# Patient Record
Sex: Male | Born: 1950
Health system: Southern US, Community
[De-identification: ages and names within clinical notes are randomized; demographics above are authoritative.]

## PROBLEM LIST (undated history)

## (undated) DIAGNOSIS — I639 Cerebral infarction, unspecified: Secondary | ICD-10-CM

## (undated) DIAGNOSIS — I10 Essential (primary) hypertension: Secondary | ICD-10-CM

## (undated) DIAGNOSIS — F32A Depression, unspecified: Secondary | ICD-10-CM

## (undated) DIAGNOSIS — M1990 Unspecified osteoarthritis, unspecified site: Secondary | ICD-10-CM

## (undated) HISTORY — PX: NO PAST SURGERIES: SHX2092

---

## 1998-01-27 ENCOUNTER — Ambulatory Visit (HOSPITAL_COMMUNITY): Admission: RE | Admit: 1998-01-27 | Discharge: 1998-01-27 | Payer: Self-pay | Admitting: Otolaryngology

## 1998-01-27 ENCOUNTER — Encounter: Payer: Self-pay | Admitting: Otolaryngology

## 1998-07-22 ENCOUNTER — Encounter: Payer: Self-pay | Admitting: Gastroenterology

## 1998-07-22 ENCOUNTER — Ambulatory Visit (HOSPITAL_COMMUNITY): Admission: RE | Admit: 1998-07-22 | Discharge: 1998-07-22 | Payer: Self-pay | Admitting: Gastroenterology

## 1998-08-23 ENCOUNTER — Ambulatory Visit (HOSPITAL_COMMUNITY): Admission: RE | Admit: 1998-08-23 | Discharge: 1998-08-23 | Payer: Self-pay | Admitting: *Deleted

## 2001-11-19 ENCOUNTER — Emergency Department (HOSPITAL_COMMUNITY): Admission: EM | Admit: 2001-11-19 | Discharge: 2001-11-19 | Payer: Self-pay | Admitting: Emergency Medicine

## 2001-11-19 ENCOUNTER — Encounter: Payer: Self-pay | Admitting: Emergency Medicine

## 2004-02-11 ENCOUNTER — Emergency Department (HOSPITAL_COMMUNITY): Admission: EM | Admit: 2004-02-11 | Discharge: 2004-02-11 | Payer: Self-pay | Admitting: Emergency Medicine

## 2004-05-03 ENCOUNTER — Ambulatory Visit (HOSPITAL_COMMUNITY): Admission: RE | Admit: 2004-05-03 | Discharge: 2004-05-03 | Payer: Self-pay | Admitting: Internal Medicine

## 2012-04-07 ENCOUNTER — Other Ambulatory Visit (HOSPITAL_COMMUNITY): Payer: Self-pay | Admitting: Internal Medicine

## 2012-04-07 ENCOUNTER — Ambulatory Visit (HOSPITAL_COMMUNITY)
Admission: RE | Admit: 2012-04-07 | Discharge: 2012-04-07 | Disposition: A | Discharge status: Home / Self Care | Payer: MEDICARE | Source: Ambulatory Visit | Attending: Internal Medicine | Admitting: Internal Medicine

## 2012-04-07 DIAGNOSIS — Q762 Congenital spondylolisthesis: Secondary | ICD-10-CM | POA: Insufficient documentation

## 2012-04-07 DIAGNOSIS — M4120 Other idiopathic scoliosis, site unspecified: Secondary | ICD-10-CM

## 2012-04-07 DIAGNOSIS — M545 Low back pain, unspecified: Secondary | ICD-10-CM | POA: Insufficient documentation

## 2012-04-07 DIAGNOSIS — M51379 Other intervertebral disc degeneration, lumbosacral region without mention of lumbar back pain or lower extremity pain: Secondary | ICD-10-CM | POA: Insufficient documentation

## 2015-03-22 DIAGNOSIS — I10 Essential (primary) hypertension: Secondary | ICD-10-CM | POA: Diagnosis not present

## 2015-03-29 DIAGNOSIS — I10 Essential (primary) hypertension: Secondary | ICD-10-CM | POA: Diagnosis not present

## 2015-03-29 DIAGNOSIS — F418 Other specified anxiety disorders: Secondary | ICD-10-CM | POA: Diagnosis not present

## 2015-03-29 DIAGNOSIS — F79 Unspecified intellectual disabilities: Secondary | ICD-10-CM | POA: Diagnosis not present

## 2015-03-29 DIAGNOSIS — Z6830 Body mass index (BMI) 30.0-30.9, adult: Secondary | ICD-10-CM | POA: Diagnosis not present

## 2015-03-29 DIAGNOSIS — M4120 Other idiopathic scoliosis, site unspecified: Secondary | ICD-10-CM | POA: Diagnosis not present

## 2015-03-29 DIAGNOSIS — J3089 Other allergic rhinitis: Secondary | ICD-10-CM | POA: Diagnosis not present

## 2015-04-26 DIAGNOSIS — F3342 Major depressive disorder, recurrent, in full remission: Secondary | ICD-10-CM | POA: Diagnosis not present

## 2015-04-27 DIAGNOSIS — Z6830 Body mass index (BMI) 30.0-30.9, adult: Secondary | ICD-10-CM | POA: Diagnosis not present

## 2015-04-27 DIAGNOSIS — M19011 Primary osteoarthritis, right shoulder: Secondary | ICD-10-CM | POA: Diagnosis not present

## 2015-04-27 DIAGNOSIS — M25511 Pain in right shoulder: Secondary | ICD-10-CM | POA: Diagnosis not present

## 2015-04-28 DIAGNOSIS — M7541 Impingement syndrome of right shoulder: Secondary | ICD-10-CM | POA: Diagnosis not present

## 2015-04-28 DIAGNOSIS — M7501 Adhesive capsulitis of right shoulder: Secondary | ICD-10-CM | POA: Diagnosis not present

## 2015-04-28 DIAGNOSIS — M19011 Primary osteoarthritis, right shoulder: Secondary | ICD-10-CM | POA: Diagnosis not present

## 2015-04-28 DIAGNOSIS — M25511 Pain in right shoulder: Secondary | ICD-10-CM | POA: Diagnosis not present

## 2015-05-10 DIAGNOSIS — M7541 Impingement syndrome of right shoulder: Secondary | ICD-10-CM | POA: Diagnosis not present

## 2015-05-17 DIAGNOSIS — M7541 Impingement syndrome of right shoulder: Secondary | ICD-10-CM | POA: Diagnosis not present

## 2015-05-24 DIAGNOSIS — M7541 Impingement syndrome of right shoulder: Secondary | ICD-10-CM | POA: Diagnosis not present

## 2015-05-26 DIAGNOSIS — M19011 Primary osteoarthritis, right shoulder: Secondary | ICD-10-CM | POA: Diagnosis not present

## 2015-05-26 DIAGNOSIS — M25511 Pain in right shoulder: Secondary | ICD-10-CM | POA: Diagnosis not present

## 2015-05-26 DIAGNOSIS — M7541 Impingement syndrome of right shoulder: Secondary | ICD-10-CM | POA: Diagnosis not present

## 2015-05-26 DIAGNOSIS — M7501 Adhesive capsulitis of right shoulder: Secondary | ICD-10-CM | POA: Diagnosis not present

## 2015-06-13 DIAGNOSIS — H25099 Other age-related incipient cataract, unspecified eye: Secondary | ICD-10-CM | POA: Diagnosis not present

## 2015-06-13 DIAGNOSIS — H5203 Hypermetropia, bilateral: Secondary | ICD-10-CM | POA: Diagnosis not present

## 2015-06-13 DIAGNOSIS — H524 Presbyopia: Secondary | ICD-10-CM | POA: Diagnosis not present

## 2015-06-13 DIAGNOSIS — H179 Unspecified corneal scar and opacity: Secondary | ICD-10-CM | POA: Diagnosis not present

## 2015-07-08 DIAGNOSIS — H903 Sensorineural hearing loss, bilateral: Secondary | ICD-10-CM | POA: Diagnosis not present

## 2015-07-08 DIAGNOSIS — H6122 Impacted cerumen, left ear: Secondary | ICD-10-CM | POA: Diagnosis not present

## 2015-10-06 DIAGNOSIS — I10 Essential (primary) hypertension: Secondary | ICD-10-CM | POA: Diagnosis not present

## 2015-10-06 DIAGNOSIS — Z125 Encounter for screening for malignant neoplasm of prostate: Secondary | ICD-10-CM | POA: Diagnosis not present

## 2015-10-13 DIAGNOSIS — K279 Peptic ulcer, site unspecified, unspecified as acute or chronic, without hemorrhage or perforation: Secondary | ICD-10-CM | POA: Diagnosis not present

## 2015-10-13 DIAGNOSIS — F418 Other specified anxiety disorders: Secondary | ICD-10-CM | POA: Diagnosis not present

## 2015-10-13 DIAGNOSIS — J309 Allergic rhinitis, unspecified: Secondary | ICD-10-CM | POA: Diagnosis not present

## 2015-10-13 DIAGNOSIS — F79 Unspecified intellectual disabilities: Secondary | ICD-10-CM | POA: Diagnosis not present

## 2015-10-13 DIAGNOSIS — Z6830 Body mass index (BMI) 30.0-30.9, adult: Secondary | ICD-10-CM | POA: Diagnosis not present

## 2015-10-13 DIAGNOSIS — R972 Elevated prostate specific antigen [PSA]: Secondary | ICD-10-CM | POA: Diagnosis not present

## 2015-10-13 DIAGNOSIS — Z1389 Encounter for screening for other disorder: Secondary | ICD-10-CM | POA: Diagnosis not present

## 2015-10-13 DIAGNOSIS — Z0000 Encounter for general adult medical examination without abnormal findings: Secondary | ICD-10-CM | POA: Diagnosis not present

## 2015-10-13 DIAGNOSIS — I10 Essential (primary) hypertension: Secondary | ICD-10-CM | POA: Diagnosis not present

## 2015-10-13 DIAGNOSIS — Z23 Encounter for immunization: Secondary | ICD-10-CM | POA: Diagnosis not present

## 2015-10-13 DIAGNOSIS — M4120 Other idiopathic scoliosis, site unspecified: Secondary | ICD-10-CM | POA: Diagnosis not present

## 2015-10-13 DIAGNOSIS — M25511 Pain in right shoulder: Secondary | ICD-10-CM | POA: Diagnosis not present

## 2015-10-14 DIAGNOSIS — Z1212 Encounter for screening for malignant neoplasm of rectum: Secondary | ICD-10-CM | POA: Diagnosis not present

## 2016-04-02 DIAGNOSIS — K279 Peptic ulcer, site unspecified, unspecified as acute or chronic, without hemorrhage or perforation: Secondary | ICD-10-CM | POA: Diagnosis not present

## 2016-04-02 DIAGNOSIS — F79 Unspecified intellectual disabilities: Secondary | ICD-10-CM | POA: Diagnosis not present

## 2016-04-02 DIAGNOSIS — F418 Other specified anxiety disorders: Secondary | ICD-10-CM | POA: Diagnosis not present

## 2016-04-02 DIAGNOSIS — M4120 Other idiopathic scoliosis, site unspecified: Secondary | ICD-10-CM | POA: Diagnosis not present

## 2016-04-02 DIAGNOSIS — Z23 Encounter for immunization: Secondary | ICD-10-CM | POA: Diagnosis not present

## 2016-04-02 DIAGNOSIS — Z6828 Body mass index (BMI) 28.0-28.9, adult: Secondary | ICD-10-CM | POA: Diagnosis not present

## 2016-04-02 DIAGNOSIS — I10 Essential (primary) hypertension: Secondary | ICD-10-CM | POA: Diagnosis not present

## 2016-04-19 DIAGNOSIS — F3342 Major depressive disorder, recurrent, in full remission: Secondary | ICD-10-CM | POA: Diagnosis not present

## 2016-06-26 DIAGNOSIS — H25812 Combined forms of age-related cataract, left eye: Secondary | ICD-10-CM | POA: Diagnosis not present

## 2016-06-26 DIAGNOSIS — H211X2 Other vascular disorders of iris and ciliary body, left eye: Secondary | ICD-10-CM | POA: Diagnosis not present

## 2016-06-26 DIAGNOSIS — H18412 Arcus senilis, left eye: Secondary | ICD-10-CM | POA: Diagnosis not present

## 2016-06-26 DIAGNOSIS — H16402 Unspecified corneal neovascularization, left eye: Secondary | ICD-10-CM | POA: Diagnosis not present

## 2016-07-02 DIAGNOSIS — M4120 Other idiopathic scoliosis, site unspecified: Secondary | ICD-10-CM | POA: Diagnosis not present

## 2016-07-02 DIAGNOSIS — Z6829 Body mass index (BMI) 29.0-29.9, adult: Secondary | ICD-10-CM | POA: Diagnosis not present

## 2016-07-02 DIAGNOSIS — I10 Essential (primary) hypertension: Secondary | ICD-10-CM | POA: Diagnosis not present

## 2016-07-02 DIAGNOSIS — G8929 Other chronic pain: Secondary | ICD-10-CM | POA: Diagnosis not present

## 2016-07-03 DIAGNOSIS — H903 Sensorineural hearing loss, bilateral: Secondary | ICD-10-CM | POA: Diagnosis not present

## 2016-07-03 DIAGNOSIS — H6122 Impacted cerumen, left ear: Secondary | ICD-10-CM | POA: Diagnosis not present

## 2016-11-06 DIAGNOSIS — Z125 Encounter for screening for malignant neoplasm of prostate: Secondary | ICD-10-CM | POA: Diagnosis not present

## 2016-11-06 DIAGNOSIS — I10 Essential (primary) hypertension: Secondary | ICD-10-CM | POA: Diagnosis not present

## 2016-11-13 DIAGNOSIS — M4120 Other idiopathic scoliosis, site unspecified: Secondary | ICD-10-CM | POA: Diagnosis not present

## 2016-11-13 DIAGNOSIS — F418 Other specified anxiety disorders: Secondary | ICD-10-CM | POA: Diagnosis not present

## 2016-11-13 DIAGNOSIS — F79 Unspecified intellectual disabilities: Secondary | ICD-10-CM | POA: Diagnosis not present

## 2016-11-13 DIAGNOSIS — R972 Elevated prostate specific antigen [PSA]: Secondary | ICD-10-CM | POA: Diagnosis not present

## 2016-11-13 DIAGNOSIS — H9190 Unspecified hearing loss, unspecified ear: Secondary | ICD-10-CM | POA: Diagnosis not present

## 2016-11-13 DIAGNOSIS — Z23 Encounter for immunization: Secondary | ICD-10-CM | POA: Diagnosis not present

## 2016-11-13 DIAGNOSIS — Z0000 Encounter for general adult medical examination without abnormal findings: Secondary | ICD-10-CM | POA: Diagnosis not present

## 2016-11-13 DIAGNOSIS — R413 Other amnesia: Secondary | ICD-10-CM | POA: Diagnosis not present

## 2016-11-13 DIAGNOSIS — I10 Essential (primary) hypertension: Secondary | ICD-10-CM | POA: Diagnosis not present

## 2016-11-13 DIAGNOSIS — Z6830 Body mass index (BMI) 30.0-30.9, adult: Secondary | ICD-10-CM | POA: Diagnosis not present

## 2016-11-13 DIAGNOSIS — G8929 Other chronic pain: Secondary | ICD-10-CM | POA: Diagnosis not present

## 2016-11-13 DIAGNOSIS — K279 Peptic ulcer, site unspecified, unspecified as acute or chronic, without hemorrhage or perforation: Secondary | ICD-10-CM | POA: Diagnosis not present

## 2016-11-13 DIAGNOSIS — Z1389 Encounter for screening for other disorder: Secondary | ICD-10-CM | POA: Diagnosis not present

## 2016-11-23 DIAGNOSIS — Z1212 Encounter for screening for malignant neoplasm of rectum: Secondary | ICD-10-CM | POA: Diagnosis not present

## 2017-02-20 DIAGNOSIS — G8929 Other chronic pain: Secondary | ICD-10-CM | POA: Diagnosis not present

## 2017-02-20 DIAGNOSIS — Z6831 Body mass index (BMI) 31.0-31.9, adult: Secondary | ICD-10-CM | POA: Diagnosis not present

## 2017-04-16 DIAGNOSIS — F3342 Major depressive disorder, recurrent, in full remission: Secondary | ICD-10-CM | POA: Diagnosis not present

## 2017-04-16 DIAGNOSIS — G809 Cerebral palsy, unspecified: Secondary | ICD-10-CM | POA: Diagnosis not present

## 2017-05-14 DIAGNOSIS — F79 Unspecified intellectual disabilities: Secondary | ICD-10-CM | POA: Diagnosis not present

## 2017-05-14 DIAGNOSIS — F418 Other specified anxiety disorders: Secondary | ICD-10-CM | POA: Diagnosis not present

## 2017-05-14 DIAGNOSIS — Z6831 Body mass index (BMI) 31.0-31.9, adult: Secondary | ICD-10-CM | POA: Diagnosis not present

## 2017-05-14 DIAGNOSIS — I10 Essential (primary) hypertension: Secondary | ICD-10-CM | POA: Diagnosis not present

## 2017-05-14 DIAGNOSIS — M4120 Other idiopathic scoliosis, site unspecified: Secondary | ICD-10-CM | POA: Diagnosis not present

## 2017-05-14 DIAGNOSIS — G8929 Other chronic pain: Secondary | ICD-10-CM | POA: Diagnosis not present

## 2017-05-14 DIAGNOSIS — R413 Other amnesia: Secondary | ICD-10-CM | POA: Diagnosis not present

## 2017-07-01 DIAGNOSIS — H6122 Impacted cerumen, left ear: Secondary | ICD-10-CM | POA: Diagnosis not present

## 2017-07-09 DIAGNOSIS — H524 Presbyopia: Secondary | ICD-10-CM | POA: Diagnosis not present

## 2017-07-09 DIAGNOSIS — H16402 Unspecified corneal neovascularization, left eye: Secondary | ICD-10-CM | POA: Diagnosis not present

## 2017-07-09 DIAGNOSIS — H25813 Combined forms of age-related cataract, bilateral: Secondary | ICD-10-CM | POA: Diagnosis not present

## 2017-07-09 DIAGNOSIS — H18412 Arcus senilis, left eye: Secondary | ICD-10-CM | POA: Diagnosis not present

## 2017-07-09 DIAGNOSIS — H5203 Hypermetropia, bilateral: Secondary | ICD-10-CM | POA: Diagnosis not present

## 2017-07-09 DIAGNOSIS — H211X2 Other vascular disorders of iris and ciliary body, left eye: Secondary | ICD-10-CM | POA: Diagnosis not present

## 2017-08-21 DIAGNOSIS — M4120 Other idiopathic scoliosis, site unspecified: Secondary | ICD-10-CM | POA: Diagnosis not present

## 2017-08-21 DIAGNOSIS — R269 Unspecified abnormalities of gait and mobility: Secondary | ICD-10-CM | POA: Diagnosis not present

## 2017-08-21 DIAGNOSIS — Z6831 Body mass index (BMI) 31.0-31.9, adult: Secondary | ICD-10-CM | POA: Diagnosis not present

## 2017-11-07 DIAGNOSIS — G8929 Other chronic pain: Secondary | ICD-10-CM | POA: Diagnosis not present

## 2017-11-07 DIAGNOSIS — M79606 Pain in leg, unspecified: Secondary | ICD-10-CM | POA: Diagnosis not present

## 2017-11-07 DIAGNOSIS — I10 Essential (primary) hypertension: Secondary | ICD-10-CM | POA: Diagnosis not present

## 2017-11-07 DIAGNOSIS — R269 Unspecified abnormalities of gait and mobility: Secondary | ICD-10-CM | POA: Diagnosis not present

## 2017-11-07 DIAGNOSIS — Z23 Encounter for immunization: Secondary | ICD-10-CM | POA: Diagnosis not present

## 2017-12-17 DIAGNOSIS — R82998 Other abnormal findings in urine: Secondary | ICD-10-CM | POA: Diagnosis not present

## 2017-12-17 DIAGNOSIS — I10 Essential (primary) hypertension: Secondary | ICD-10-CM | POA: Diagnosis not present

## 2017-12-17 DIAGNOSIS — Z125 Encounter for screening for malignant neoplasm of prostate: Secondary | ICD-10-CM | POA: Diagnosis not present

## 2017-12-24 DIAGNOSIS — M4120 Other idiopathic scoliosis, site unspecified: Secondary | ICD-10-CM | POA: Diagnosis not present

## 2017-12-24 DIAGNOSIS — Z0000 Encounter for general adult medical examination without abnormal findings: Secondary | ICD-10-CM | POA: Diagnosis not present

## 2017-12-24 DIAGNOSIS — Z1389 Encounter for screening for other disorder: Secondary | ICD-10-CM | POA: Diagnosis not present

## 2017-12-24 DIAGNOSIS — I10 Essential (primary) hypertension: Secondary | ICD-10-CM | POA: Diagnosis not present

## 2017-12-24 DIAGNOSIS — R413 Other amnesia: Secondary | ICD-10-CM | POA: Diagnosis not present

## 2017-12-24 DIAGNOSIS — G8929 Other chronic pain: Secondary | ICD-10-CM | POA: Diagnosis not present

## 2017-12-24 DIAGNOSIS — J3089 Other allergic rhinitis: Secondary | ICD-10-CM | POA: Diagnosis not present

## 2017-12-24 DIAGNOSIS — R2689 Other abnormalities of gait and mobility: Secondary | ICD-10-CM | POA: Diagnosis not present

## 2017-12-24 DIAGNOSIS — Z6835 Body mass index (BMI) 35.0-35.9, adult: Secondary | ICD-10-CM | POA: Diagnosis not present

## 2017-12-24 DIAGNOSIS — F418 Other specified anxiety disorders: Secondary | ICD-10-CM | POA: Diagnosis not present

## 2017-12-24 DIAGNOSIS — Z23 Encounter for immunization: Secondary | ICD-10-CM | POA: Diagnosis not present

## 2017-12-24 DIAGNOSIS — F79 Unspecified intellectual disabilities: Secondary | ICD-10-CM | POA: Diagnosis not present

## 2017-12-27 DIAGNOSIS — Z1212 Encounter for screening for malignant neoplasm of rectum: Secondary | ICD-10-CM | POA: Diagnosis not present

## 2018-03-25 DIAGNOSIS — I10 Essential (primary) hypertension: Secondary | ICD-10-CM | POA: Diagnosis not present

## 2018-03-25 DIAGNOSIS — Z6833 Body mass index (BMI) 33.0-33.9, adult: Secondary | ICD-10-CM | POA: Diagnosis not present

## 2018-03-25 DIAGNOSIS — M4120 Other idiopathic scoliosis, site unspecified: Secondary | ICD-10-CM | POA: Diagnosis not present

## 2018-03-25 DIAGNOSIS — G8929 Other chronic pain: Secondary | ICD-10-CM | POA: Diagnosis not present

## 2018-05-06 DIAGNOSIS — G809 Cerebral palsy, unspecified: Secondary | ICD-10-CM | POA: Diagnosis not present

## 2018-05-06 DIAGNOSIS — F3342 Major depressive disorder, recurrent, in full remission: Secondary | ICD-10-CM | POA: Diagnosis not present

## 2018-06-23 DIAGNOSIS — F418 Other specified anxiety disorders: Secondary | ICD-10-CM | POA: Diagnosis not present

## 2018-06-23 DIAGNOSIS — K279 Peptic ulcer, site unspecified, unspecified as acute or chronic, without hemorrhage or perforation: Secondary | ICD-10-CM | POA: Diagnosis not present

## 2018-06-23 DIAGNOSIS — G8929 Other chronic pain: Secondary | ICD-10-CM | POA: Diagnosis not present

## 2018-06-23 DIAGNOSIS — F79 Unspecified intellectual disabilities: Secondary | ICD-10-CM | POA: Diagnosis not present

## 2018-06-23 DIAGNOSIS — M4120 Other idiopathic scoliosis, site unspecified: Secondary | ICD-10-CM | POA: Diagnosis not present

## 2018-06-23 DIAGNOSIS — I10 Essential (primary) hypertension: Secondary | ICD-10-CM | POA: Diagnosis not present

## 2018-09-18 DIAGNOSIS — M4120 Other idiopathic scoliosis, site unspecified: Secondary | ICD-10-CM | POA: Diagnosis not present

## 2018-09-18 DIAGNOSIS — I10 Essential (primary) hypertension: Secondary | ICD-10-CM | POA: Diagnosis not present

## 2018-09-18 DIAGNOSIS — F418 Other specified anxiety disorders: Secondary | ICD-10-CM | POA: Diagnosis not present

## 2018-09-18 DIAGNOSIS — F79 Unspecified intellectual disabilities: Secondary | ICD-10-CM | POA: Diagnosis not present

## 2018-09-18 DIAGNOSIS — J069 Acute upper respiratory infection, unspecified: Secondary | ICD-10-CM | POA: Diagnosis not present

## 2018-09-18 DIAGNOSIS — G8929 Other chronic pain: Secondary | ICD-10-CM | POA: Diagnosis not present

## 2018-09-18 DIAGNOSIS — J309 Allergic rhinitis, unspecified: Secondary | ICD-10-CM | POA: Diagnosis not present

## 2018-09-18 DIAGNOSIS — R413 Other amnesia: Secondary | ICD-10-CM | POA: Diagnosis not present

## 2018-09-25 DIAGNOSIS — R000 Tachycardia, unspecified: Secondary | ICD-10-CM | POA: Diagnosis not present

## 2018-09-25 DIAGNOSIS — R05 Cough: Secondary | ICD-10-CM | POA: Diagnosis not present

## 2018-09-25 DIAGNOSIS — J069 Acute upper respiratory infection, unspecified: Secondary | ICD-10-CM | POA: Diagnosis not present

## 2018-09-25 DIAGNOSIS — Z20818 Contact with and (suspected) exposure to other bacterial communicable diseases: Secondary | ICD-10-CM | POA: Diagnosis not present

## 2018-10-02 DIAGNOSIS — H903 Sensorineural hearing loss, bilateral: Secondary | ICD-10-CM | POA: Diagnosis not present

## 2018-10-08 DIAGNOSIS — H6123 Impacted cerumen, bilateral: Secondary | ICD-10-CM | POA: Diagnosis not present

## 2018-10-16 DIAGNOSIS — Z23 Encounter for immunization: Secondary | ICD-10-CM | POA: Diagnosis not present

## 2018-10-30 DIAGNOSIS — H524 Presbyopia: Secondary | ICD-10-CM | POA: Diagnosis not present

## 2018-10-30 DIAGNOSIS — H1851 Endothelial corneal dystrophy: Secondary | ICD-10-CM | POA: Diagnosis not present

## 2018-10-30 DIAGNOSIS — H25813 Combined forms of age-related cataract, bilateral: Secondary | ICD-10-CM | POA: Diagnosis not present

## 2018-10-30 DIAGNOSIS — H5203 Hypermetropia, bilateral: Secondary | ICD-10-CM | POA: Diagnosis not present

## 2018-12-17 DIAGNOSIS — H16221 Keratoconjunctivitis sicca, not specified as Sjogren's, right eye: Secondary | ICD-10-CM | POA: Diagnosis not present

## 2018-12-17 DIAGNOSIS — H16223 Keratoconjunctivitis sicca, not specified as Sjogren's, bilateral: Secondary | ICD-10-CM | POA: Diagnosis not present

## 2018-12-17 DIAGNOSIS — H2513 Age-related nuclear cataract, bilateral: Secondary | ICD-10-CM | POA: Diagnosis not present

## 2018-12-17 DIAGNOSIS — H40001 Preglaucoma, unspecified, right eye: Secondary | ICD-10-CM | POA: Diagnosis not present

## 2018-12-17 DIAGNOSIS — H25013 Cortical age-related cataract, bilateral: Secondary | ICD-10-CM | POA: Diagnosis not present

## 2018-12-17 DIAGNOSIS — H16222 Keratoconjunctivitis sicca, not specified as Sjogren's, left eye: Secondary | ICD-10-CM | POA: Diagnosis not present

## 2018-12-22 DIAGNOSIS — H16223 Keratoconjunctivitis sicca, not specified as Sjogren's, bilateral: Secondary | ICD-10-CM | POA: Diagnosis not present

## 2018-12-22 DIAGNOSIS — H5440 Blindness, one eye, unspecified eye: Secondary | ICD-10-CM | POA: Diagnosis not present

## 2018-12-22 DIAGNOSIS — H2513 Age-related nuclear cataract, bilateral: Secondary | ICD-10-CM | POA: Diagnosis not present

## 2018-12-22 DIAGNOSIS — H40011 Open angle with borderline findings, low risk, right eye: Secondary | ICD-10-CM | POA: Diagnosis not present

## 2018-12-30 DIAGNOSIS — H2511 Age-related nuclear cataract, right eye: Secondary | ICD-10-CM | POA: Diagnosis not present

## 2018-12-30 DIAGNOSIS — H2513 Age-related nuclear cataract, bilateral: Secondary | ICD-10-CM | POA: Diagnosis not present

## 2019-01-04 DIAGNOSIS — R7989 Other specified abnormal findings of blood chemistry: Secondary | ICD-10-CM | POA: Diagnosis not present

## 2019-01-04 DIAGNOSIS — Z125 Encounter for screening for malignant neoplasm of prostate: Secondary | ICD-10-CM | POA: Diagnosis not present

## 2019-01-04 DIAGNOSIS — I10 Essential (primary) hypertension: Secondary | ICD-10-CM | POA: Diagnosis not present

## 2019-01-05 DIAGNOSIS — H2511 Age-related nuclear cataract, right eye: Secondary | ICD-10-CM | POA: Diagnosis not present

## 2019-01-05 DIAGNOSIS — H2513 Age-related nuclear cataract, bilateral: Secondary | ICD-10-CM | POA: Diagnosis not present

## 2019-01-12 DIAGNOSIS — I10 Essential (primary) hypertension: Secondary | ICD-10-CM | POA: Diagnosis not present

## 2019-01-12 DIAGNOSIS — R413 Other amnesia: Secondary | ICD-10-CM | POA: Diagnosis not present

## 2019-01-12 DIAGNOSIS — Z23 Encounter for immunization: Secondary | ICD-10-CM | POA: Diagnosis not present

## 2019-01-12 DIAGNOSIS — H547 Unspecified visual loss: Secondary | ICD-10-CM | POA: Diagnosis not present

## 2019-01-12 DIAGNOSIS — R82998 Other abnormal findings in urine: Secondary | ICD-10-CM | POA: Diagnosis not present

## 2019-01-12 DIAGNOSIS — H9190 Unspecified hearing loss, unspecified ear: Secondary | ICD-10-CM | POA: Diagnosis not present

## 2019-01-12 DIAGNOSIS — J309 Allergic rhinitis, unspecified: Secondary | ICD-10-CM | POA: Diagnosis not present

## 2019-01-12 DIAGNOSIS — F79 Unspecified intellectual disabilities: Secondary | ICD-10-CM | POA: Diagnosis not present

## 2019-01-12 DIAGNOSIS — R972 Elevated prostate specific antigen [PSA]: Secondary | ICD-10-CM | POA: Diagnosis not present

## 2019-01-12 DIAGNOSIS — Z0000 Encounter for general adult medical examination without abnormal findings: Secondary | ICD-10-CM | POA: Diagnosis not present

## 2019-01-12 DIAGNOSIS — F418 Other specified anxiety disorders: Secondary | ICD-10-CM | POA: Diagnosis not present

## 2019-01-12 DIAGNOSIS — M4120 Other idiopathic scoliosis, site unspecified: Secondary | ICD-10-CM | POA: Diagnosis not present

## 2019-02-16 DIAGNOSIS — H44513 Absolute glaucoma, bilateral: Secondary | ICD-10-CM | POA: Diagnosis not present

## 2019-02-16 DIAGNOSIS — H401134 Primary open-angle glaucoma, bilateral, indeterminate stage: Secondary | ICD-10-CM | POA: Diagnosis not present

## 2019-02-24 DIAGNOSIS — Z20822 Contact with and (suspected) exposure to covid-19: Secondary | ICD-10-CM | POA: Diagnosis not present

## 2019-02-24 DIAGNOSIS — H44513 Absolute glaucoma, bilateral: Secondary | ICD-10-CM | POA: Diagnosis not present

## 2019-02-24 DIAGNOSIS — Z01812 Encounter for preprocedural laboratory examination: Secondary | ICD-10-CM | POA: Diagnosis not present

## 2019-03-03 DIAGNOSIS — H44511 Absolute glaucoma, right eye: Secondary | ICD-10-CM | POA: Diagnosis not present

## 2019-03-03 DIAGNOSIS — Z8711 Personal history of peptic ulcer disease: Secondary | ICD-10-CM | POA: Diagnosis not present

## 2019-03-03 DIAGNOSIS — M419 Scoliosis, unspecified: Secondary | ICD-10-CM | POA: Diagnosis not present

## 2019-03-03 DIAGNOSIS — F419 Anxiety disorder, unspecified: Secondary | ICD-10-CM | POA: Diagnosis not present

## 2019-03-03 DIAGNOSIS — Z8673 Personal history of transient ischemic attack (TIA), and cerebral infarction without residual deficits: Secondary | ICD-10-CM | POA: Diagnosis not present

## 2019-03-03 DIAGNOSIS — I10 Essential (primary) hypertension: Secondary | ICD-10-CM | POA: Diagnosis not present

## 2019-03-04 DIAGNOSIS — H44513 Absolute glaucoma, bilateral: Secondary | ICD-10-CM | POA: Diagnosis not present

## 2019-03-04 DIAGNOSIS — Z79899 Other long term (current) drug therapy: Secondary | ICD-10-CM | POA: Diagnosis not present

## 2019-03-04 DIAGNOSIS — Z48810 Encounter for surgical aftercare following surgery on the sense organs: Secondary | ICD-10-CM | POA: Diagnosis not present

## 2019-03-04 DIAGNOSIS — Z1212 Encounter for screening for malignant neoplasm of rectum: Secondary | ICD-10-CM | POA: Diagnosis not present

## 2019-03-10 ENCOUNTER — Ambulatory Visit: Payer: Self-pay

## 2019-03-19 ENCOUNTER — Ambulatory Visit: Payer: Medicare Other | Attending: Internal Medicine

## 2019-03-19 DIAGNOSIS — Z23 Encounter for immunization: Secondary | ICD-10-CM | POA: Insufficient documentation

## 2019-03-31 ENCOUNTER — Ambulatory Visit: Payer: Self-pay

## 2019-04-07 DIAGNOSIS — G8929 Other chronic pain: Secondary | ICD-10-CM | POA: Diagnosis not present

## 2019-04-07 DIAGNOSIS — L299 Pruritus, unspecified: Secondary | ICD-10-CM | POA: Diagnosis not present

## 2019-04-07 DIAGNOSIS — M4120 Other idiopathic scoliosis, site unspecified: Secondary | ICD-10-CM | POA: Diagnosis not present

## 2019-04-13 ENCOUNTER — Ambulatory Visit: Payer: Medicare Other | Attending: Internal Medicine

## 2019-04-13 DIAGNOSIS — Z23 Encounter for immunization: Secondary | ICD-10-CM | POA: Insufficient documentation

## 2019-04-13 NOTE — Progress Notes (Signed)
   Covid-19 Vaccination [facility]  Name:  Jeffery Fox    MRN: [id number] DOB: [birth year]  04/13/2019  [name] was observed post Covid-19 immunization for 15 minutes without incidence. He was provided with Vaccine Information Sheet and instruction to access the V-Safe system.   [name] was instructed to call 911 with any severe reactions post vaccine: Marland Kitchen Difficulty breathing  . Swelling of your face and throat  . A fast heartbeat  . A bad rash all over your body  . Dizziness and weakness    Immunizations Administered    Name Date Dose VIS Date Route   Pfizer COVID-19 Vaccine 04/13/2019  5:32 PM 0.3 mL 01/23/2019 Intramuscular   Manufacturer: [employer], [employer]   Lot: [id number]   NDC: [address]-1

## 2019-04-27 DIAGNOSIS — G809 Cerebral palsy, unspecified: Secondary | ICD-10-CM | POA: Diagnosis not present

## 2019-04-27 DIAGNOSIS — F3342 Major depressive disorder, recurrent, in full remission: Secondary | ICD-10-CM | POA: Diagnosis not present

## 2019-06-17 DIAGNOSIS — H2513 Age-related nuclear cataract, bilateral: Secondary | ICD-10-CM | POA: Diagnosis not present

## 2019-06-17 DIAGNOSIS — H44513 Absolute glaucoma, bilateral: Secondary | ICD-10-CM | POA: Diagnosis not present

## 2019-06-22 DIAGNOSIS — H44513 Absolute glaucoma, bilateral: Secondary | ICD-10-CM | POA: Diagnosis not present

## 2019-07-03 DIAGNOSIS — M4120 Other idiopathic scoliosis, site unspecified: Secondary | ICD-10-CM | POA: Diagnosis not present

## 2019-07-03 DIAGNOSIS — G8929 Other chronic pain: Secondary | ICD-10-CM | POA: Diagnosis not present

## 2019-07-27 DIAGNOSIS — Z01818 Encounter for other preprocedural examination: Secondary | ICD-10-CM | POA: Diagnosis not present

## 2019-07-27 DIAGNOSIS — H2589 Other age-related cataract: Secondary | ICD-10-CM | POA: Diagnosis not present

## 2019-08-20 DIAGNOSIS — I10 Essential (primary) hypertension: Secondary | ICD-10-CM | POA: Diagnosis not present

## 2019-08-20 DIAGNOSIS — M7730 Calcaneal spur, unspecified foot: Secondary | ICD-10-CM | POA: Diagnosis not present

## 2019-08-20 DIAGNOSIS — F39 Unspecified mood [affective] disorder: Secondary | ICD-10-CM | POA: Diagnosis not present

## 2019-08-20 DIAGNOSIS — H543 Unqualified visual loss, both eyes: Secondary | ICD-10-CM | POA: Diagnosis not present

## 2019-08-20 DIAGNOSIS — F79 Unspecified intellectual disabilities: Secondary | ICD-10-CM | POA: Diagnosis not present

## 2019-08-20 DIAGNOSIS — Z993 Dependence on wheelchair: Secondary | ICD-10-CM | POA: Diagnosis not present

## 2019-08-20 DIAGNOSIS — I69398 Other sequelae of cerebral infarction: Secondary | ICD-10-CM | POA: Diagnosis not present

## 2019-08-20 DIAGNOSIS — H259 Unspecified age-related cataract: Secondary | ICD-10-CM | POA: Diagnosis not present

## 2019-08-20 DIAGNOSIS — F418 Other specified anxiety disorders: Secondary | ICD-10-CM | POA: Diagnosis not present

## 2019-08-20 DIAGNOSIS — Z79891 Long term (current) use of opiate analgesic: Secondary | ICD-10-CM | POA: Diagnosis not present

## 2019-08-20 DIAGNOSIS — M7712 Lateral epicondylitis, left elbow: Secondary | ICD-10-CM | POA: Diagnosis not present

## 2019-08-20 DIAGNOSIS — R269 Unspecified abnormalities of gait and mobility: Secondary | ICD-10-CM | POA: Diagnosis not present

## 2019-08-20 DIAGNOSIS — R296 Repeated falls: Secondary | ICD-10-CM | POA: Diagnosis not present

## 2019-08-20 DIAGNOSIS — M4125 Other idiopathic scoliosis, thoracolumbar region: Secondary | ICD-10-CM | POA: Diagnosis not present

## 2019-08-20 DIAGNOSIS — R413 Other amnesia: Secondary | ICD-10-CM | POA: Diagnosis not present

## 2019-08-20 DIAGNOSIS — G8929 Other chronic pain: Secondary | ICD-10-CM | POA: Diagnosis not present

## 2019-08-20 DIAGNOSIS — Z9181 History of falling: Secondary | ICD-10-CM | POA: Diagnosis not present

## 2019-08-24 DIAGNOSIS — R269 Unspecified abnormalities of gait and mobility: Secondary | ICD-10-CM | POA: Diagnosis not present

## 2019-08-24 DIAGNOSIS — M4125 Other idiopathic scoliosis, thoracolumbar region: Secondary | ICD-10-CM | POA: Diagnosis not present

## 2019-08-24 DIAGNOSIS — H259 Unspecified age-related cataract: Secondary | ICD-10-CM | POA: Diagnosis not present

## 2019-08-24 DIAGNOSIS — I69398 Other sequelae of cerebral infarction: Secondary | ICD-10-CM | POA: Diagnosis not present

## 2019-08-24 DIAGNOSIS — G8929 Other chronic pain: Secondary | ICD-10-CM | POA: Diagnosis not present

## 2019-08-24 DIAGNOSIS — H543 Unqualified visual loss, both eyes: Secondary | ICD-10-CM | POA: Diagnosis not present

## 2019-08-25 DIAGNOSIS — I69398 Other sequelae of cerebral infarction: Secondary | ICD-10-CM | POA: Diagnosis not present

## 2019-08-25 DIAGNOSIS — G8929 Other chronic pain: Secondary | ICD-10-CM | POA: Diagnosis not present

## 2019-08-25 DIAGNOSIS — H259 Unspecified age-related cataract: Secondary | ICD-10-CM | POA: Diagnosis not present

## 2019-08-25 DIAGNOSIS — H543 Unqualified visual loss, both eyes: Secondary | ICD-10-CM | POA: Diagnosis not present

## 2019-08-25 DIAGNOSIS — R269 Unspecified abnormalities of gait and mobility: Secondary | ICD-10-CM | POA: Diagnosis not present

## 2019-08-25 DIAGNOSIS — M4125 Other idiopathic scoliosis, thoracolumbar region: Secondary | ICD-10-CM | POA: Diagnosis not present

## 2019-08-26 DIAGNOSIS — I69398 Other sequelae of cerebral infarction: Secondary | ICD-10-CM | POA: Diagnosis not present

## 2019-08-26 DIAGNOSIS — H259 Unspecified age-related cataract: Secondary | ICD-10-CM | POA: Diagnosis not present

## 2019-08-26 DIAGNOSIS — G8929 Other chronic pain: Secondary | ICD-10-CM | POA: Diagnosis not present

## 2019-08-26 DIAGNOSIS — H543 Unqualified visual loss, both eyes: Secondary | ICD-10-CM | POA: Diagnosis not present

## 2019-08-26 DIAGNOSIS — M4125 Other idiopathic scoliosis, thoracolumbar region: Secondary | ICD-10-CM | POA: Diagnosis not present

## 2019-08-26 DIAGNOSIS — R269 Unspecified abnormalities of gait and mobility: Secondary | ICD-10-CM | POA: Diagnosis not present

## 2019-08-31 DIAGNOSIS — M4125 Other idiopathic scoliosis, thoracolumbar region: Secondary | ICD-10-CM | POA: Diagnosis not present

## 2019-08-31 DIAGNOSIS — H259 Unspecified age-related cataract: Secondary | ICD-10-CM | POA: Diagnosis not present

## 2019-08-31 DIAGNOSIS — G8929 Other chronic pain: Secondary | ICD-10-CM | POA: Diagnosis not present

## 2019-08-31 DIAGNOSIS — H543 Unqualified visual loss, both eyes: Secondary | ICD-10-CM | POA: Diagnosis not present

## 2019-08-31 DIAGNOSIS — R269 Unspecified abnormalities of gait and mobility: Secondary | ICD-10-CM | POA: Diagnosis not present

## 2019-08-31 DIAGNOSIS — I69398 Other sequelae of cerebral infarction: Secondary | ICD-10-CM | POA: Diagnosis not present

## 2019-09-01 DIAGNOSIS — I69398 Other sequelae of cerebral infarction: Secondary | ICD-10-CM | POA: Diagnosis not present

## 2019-09-01 DIAGNOSIS — M4125 Other idiopathic scoliosis, thoracolumbar region: Secondary | ICD-10-CM | POA: Diagnosis not present

## 2019-09-01 DIAGNOSIS — H543 Unqualified visual loss, both eyes: Secondary | ICD-10-CM | POA: Diagnosis not present

## 2019-09-01 DIAGNOSIS — G8929 Other chronic pain: Secondary | ICD-10-CM | POA: Diagnosis not present

## 2019-09-01 DIAGNOSIS — H259 Unspecified age-related cataract: Secondary | ICD-10-CM | POA: Diagnosis not present

## 2019-09-01 DIAGNOSIS — R269 Unspecified abnormalities of gait and mobility: Secondary | ICD-10-CM | POA: Diagnosis not present

## 2019-09-02 DIAGNOSIS — M4125 Other idiopathic scoliosis, thoracolumbar region: Secondary | ICD-10-CM | POA: Diagnosis not present

## 2019-09-02 DIAGNOSIS — H543 Unqualified visual loss, both eyes: Secondary | ICD-10-CM | POA: Diagnosis not present

## 2019-09-02 DIAGNOSIS — H259 Unspecified age-related cataract: Secondary | ICD-10-CM | POA: Diagnosis not present

## 2019-09-02 DIAGNOSIS — G8929 Other chronic pain: Secondary | ICD-10-CM | POA: Diagnosis not present

## 2019-09-02 DIAGNOSIS — I69398 Other sequelae of cerebral infarction: Secondary | ICD-10-CM | POA: Diagnosis not present

## 2019-09-02 DIAGNOSIS — R269 Unspecified abnormalities of gait and mobility: Secondary | ICD-10-CM | POA: Diagnosis not present

## 2019-09-07 DIAGNOSIS — M4125 Other idiopathic scoliosis, thoracolumbar region: Secondary | ICD-10-CM | POA: Diagnosis not present

## 2019-09-07 DIAGNOSIS — I69398 Other sequelae of cerebral infarction: Secondary | ICD-10-CM | POA: Diagnosis not present

## 2019-09-07 DIAGNOSIS — G8929 Other chronic pain: Secondary | ICD-10-CM | POA: Diagnosis not present

## 2019-09-07 DIAGNOSIS — H543 Unqualified visual loss, both eyes: Secondary | ICD-10-CM | POA: Diagnosis not present

## 2019-09-07 DIAGNOSIS — H259 Unspecified age-related cataract: Secondary | ICD-10-CM | POA: Diagnosis not present

## 2019-09-07 DIAGNOSIS — R269 Unspecified abnormalities of gait and mobility: Secondary | ICD-10-CM | POA: Diagnosis not present

## 2019-09-08 DIAGNOSIS — I69398 Other sequelae of cerebral infarction: Secondary | ICD-10-CM | POA: Diagnosis not present

## 2019-09-08 DIAGNOSIS — H543 Unqualified visual loss, both eyes: Secondary | ICD-10-CM | POA: Diagnosis not present

## 2019-09-08 DIAGNOSIS — G8929 Other chronic pain: Secondary | ICD-10-CM | POA: Diagnosis not present

## 2019-09-08 DIAGNOSIS — H259 Unspecified age-related cataract: Secondary | ICD-10-CM | POA: Diagnosis not present

## 2019-09-08 DIAGNOSIS — M4125 Other idiopathic scoliosis, thoracolumbar region: Secondary | ICD-10-CM | POA: Diagnosis not present

## 2019-09-08 DIAGNOSIS — R269 Unspecified abnormalities of gait and mobility: Secondary | ICD-10-CM | POA: Diagnosis not present

## 2019-09-09 DIAGNOSIS — G8929 Other chronic pain: Secondary | ICD-10-CM | POA: Diagnosis not present

## 2019-09-09 DIAGNOSIS — H543 Unqualified visual loss, both eyes: Secondary | ICD-10-CM | POA: Diagnosis not present

## 2019-09-09 DIAGNOSIS — H259 Unspecified age-related cataract: Secondary | ICD-10-CM | POA: Diagnosis not present

## 2019-09-09 DIAGNOSIS — R269 Unspecified abnormalities of gait and mobility: Secondary | ICD-10-CM | POA: Diagnosis not present

## 2019-09-09 DIAGNOSIS — I69398 Other sequelae of cerebral infarction: Secondary | ICD-10-CM | POA: Diagnosis not present

## 2019-09-09 DIAGNOSIS — M4125 Other idiopathic scoliosis, thoracolumbar region: Secondary | ICD-10-CM | POA: Diagnosis not present

## 2019-09-14 DIAGNOSIS — G8929 Other chronic pain: Secondary | ICD-10-CM | POA: Diagnosis not present

## 2019-09-14 DIAGNOSIS — H543 Unqualified visual loss, both eyes: Secondary | ICD-10-CM | POA: Diagnosis not present

## 2019-09-14 DIAGNOSIS — M4125 Other idiopathic scoliosis, thoracolumbar region: Secondary | ICD-10-CM | POA: Diagnosis not present

## 2019-09-14 DIAGNOSIS — I69398 Other sequelae of cerebral infarction: Secondary | ICD-10-CM | POA: Diagnosis not present

## 2019-09-14 DIAGNOSIS — R269 Unspecified abnormalities of gait and mobility: Secondary | ICD-10-CM | POA: Diagnosis not present

## 2019-09-14 DIAGNOSIS — H259 Unspecified age-related cataract: Secondary | ICD-10-CM | POA: Diagnosis not present

## 2019-09-16 DIAGNOSIS — R269 Unspecified abnormalities of gait and mobility: Secondary | ICD-10-CM | POA: Diagnosis not present

## 2019-09-16 DIAGNOSIS — G8929 Other chronic pain: Secondary | ICD-10-CM | POA: Diagnosis not present

## 2019-09-16 DIAGNOSIS — H259 Unspecified age-related cataract: Secondary | ICD-10-CM | POA: Diagnosis not present

## 2019-09-16 DIAGNOSIS — I69398 Other sequelae of cerebral infarction: Secondary | ICD-10-CM | POA: Diagnosis not present

## 2019-09-16 DIAGNOSIS — H543 Unqualified visual loss, both eyes: Secondary | ICD-10-CM | POA: Diagnosis not present

## 2019-09-16 DIAGNOSIS — M4125 Other idiopathic scoliosis, thoracolumbar region: Secondary | ICD-10-CM | POA: Diagnosis not present

## 2019-09-18 DIAGNOSIS — H259 Unspecified age-related cataract: Secondary | ICD-10-CM | POA: Diagnosis not present

## 2019-09-18 DIAGNOSIS — M4125 Other idiopathic scoliosis, thoracolumbar region: Secondary | ICD-10-CM | POA: Diagnosis not present

## 2019-09-18 DIAGNOSIS — G8929 Other chronic pain: Secondary | ICD-10-CM | POA: Diagnosis not present

## 2019-09-18 DIAGNOSIS — H543 Unqualified visual loss, both eyes: Secondary | ICD-10-CM | POA: Diagnosis not present

## 2019-09-18 DIAGNOSIS — R269 Unspecified abnormalities of gait and mobility: Secondary | ICD-10-CM | POA: Diagnosis not present

## 2019-09-18 DIAGNOSIS — I69398 Other sequelae of cerebral infarction: Secondary | ICD-10-CM | POA: Diagnosis not present

## 2019-09-19 DIAGNOSIS — Z79891 Long term (current) use of opiate analgesic: Secondary | ICD-10-CM | POA: Diagnosis not present

## 2019-09-19 DIAGNOSIS — H543 Unqualified visual loss, both eyes: Secondary | ICD-10-CM | POA: Diagnosis not present

## 2019-09-19 DIAGNOSIS — M7730 Calcaneal spur, unspecified foot: Secondary | ICD-10-CM | POA: Diagnosis not present

## 2019-09-19 DIAGNOSIS — I10 Essential (primary) hypertension: Secondary | ICD-10-CM | POA: Diagnosis not present

## 2019-09-19 DIAGNOSIS — M4125 Other idiopathic scoliosis, thoracolumbar region: Secondary | ICD-10-CM | POA: Diagnosis not present

## 2019-09-19 DIAGNOSIS — G8929 Other chronic pain: Secondary | ICD-10-CM | POA: Diagnosis not present

## 2019-09-19 DIAGNOSIS — R296 Repeated falls: Secondary | ICD-10-CM | POA: Diagnosis not present

## 2019-09-19 DIAGNOSIS — F79 Unspecified intellectual disabilities: Secondary | ICD-10-CM | POA: Diagnosis not present

## 2019-09-19 DIAGNOSIS — F418 Other specified anxiety disorders: Secondary | ICD-10-CM | POA: Diagnosis not present

## 2019-09-19 DIAGNOSIS — I69398 Other sequelae of cerebral infarction: Secondary | ICD-10-CM | POA: Diagnosis not present

## 2019-09-19 DIAGNOSIS — Z993 Dependence on wheelchair: Secondary | ICD-10-CM | POA: Diagnosis not present

## 2019-09-19 DIAGNOSIS — M7712 Lateral epicondylitis, left elbow: Secondary | ICD-10-CM | POA: Diagnosis not present

## 2019-09-19 DIAGNOSIS — H259 Unspecified age-related cataract: Secondary | ICD-10-CM | POA: Diagnosis not present

## 2019-09-19 DIAGNOSIS — R413 Other amnesia: Secondary | ICD-10-CM | POA: Diagnosis not present

## 2019-09-19 DIAGNOSIS — R269 Unspecified abnormalities of gait and mobility: Secondary | ICD-10-CM | POA: Diagnosis not present

## 2019-09-19 DIAGNOSIS — F39 Unspecified mood [affective] disorder: Secondary | ICD-10-CM | POA: Diagnosis not present

## 2019-09-19 DIAGNOSIS — Z9181 History of falling: Secondary | ICD-10-CM | POA: Diagnosis not present

## 2019-09-21 DIAGNOSIS — H543 Unqualified visual loss, both eyes: Secondary | ICD-10-CM | POA: Diagnosis not present

## 2019-09-21 DIAGNOSIS — R269 Unspecified abnormalities of gait and mobility: Secondary | ICD-10-CM | POA: Diagnosis not present

## 2019-09-21 DIAGNOSIS — M4125 Other idiopathic scoliosis, thoracolumbar region: Secondary | ICD-10-CM | POA: Diagnosis not present

## 2019-09-21 DIAGNOSIS — I69398 Other sequelae of cerebral infarction: Secondary | ICD-10-CM | POA: Diagnosis not present

## 2019-09-21 DIAGNOSIS — H259 Unspecified age-related cataract: Secondary | ICD-10-CM | POA: Diagnosis not present

## 2019-09-21 DIAGNOSIS — G8929 Other chronic pain: Secondary | ICD-10-CM | POA: Diagnosis not present

## 2019-09-23 DIAGNOSIS — I69398 Other sequelae of cerebral infarction: Secondary | ICD-10-CM | POA: Diagnosis not present

## 2019-09-23 DIAGNOSIS — R269 Unspecified abnormalities of gait and mobility: Secondary | ICD-10-CM | POA: Diagnosis not present

## 2019-09-23 DIAGNOSIS — H543 Unqualified visual loss, both eyes: Secondary | ICD-10-CM | POA: Diagnosis not present

## 2019-09-23 DIAGNOSIS — M4125 Other idiopathic scoliosis, thoracolumbar region: Secondary | ICD-10-CM | POA: Diagnosis not present

## 2019-09-23 DIAGNOSIS — H259 Unspecified age-related cataract: Secondary | ICD-10-CM | POA: Diagnosis not present

## 2019-09-23 DIAGNOSIS — G8929 Other chronic pain: Secondary | ICD-10-CM | POA: Diagnosis not present

## 2019-09-28 DIAGNOSIS — H259 Unspecified age-related cataract: Secondary | ICD-10-CM | POA: Diagnosis not present

## 2019-09-28 DIAGNOSIS — M4125 Other idiopathic scoliosis, thoracolumbar region: Secondary | ICD-10-CM | POA: Diagnosis not present

## 2019-09-28 DIAGNOSIS — H543 Unqualified visual loss, both eyes: Secondary | ICD-10-CM | POA: Diagnosis not present

## 2019-09-28 DIAGNOSIS — I69398 Other sequelae of cerebral infarction: Secondary | ICD-10-CM | POA: Diagnosis not present

## 2019-09-28 DIAGNOSIS — R269 Unspecified abnormalities of gait and mobility: Secondary | ICD-10-CM | POA: Diagnosis not present

## 2019-09-28 DIAGNOSIS — G8929 Other chronic pain: Secondary | ICD-10-CM | POA: Diagnosis not present

## 2019-09-29 DIAGNOSIS — H543 Unqualified visual loss, both eyes: Secondary | ICD-10-CM | POA: Diagnosis not present

## 2019-09-29 DIAGNOSIS — G8929 Other chronic pain: Secondary | ICD-10-CM | POA: Diagnosis not present

## 2019-09-29 DIAGNOSIS — R269 Unspecified abnormalities of gait and mobility: Secondary | ICD-10-CM | POA: Diagnosis not present

## 2019-09-29 DIAGNOSIS — I69398 Other sequelae of cerebral infarction: Secondary | ICD-10-CM | POA: Diagnosis not present

## 2019-09-29 DIAGNOSIS — M4125 Other idiopathic scoliosis, thoracolumbar region: Secondary | ICD-10-CM | POA: Diagnosis not present

## 2019-09-29 DIAGNOSIS — H259 Unspecified age-related cataract: Secondary | ICD-10-CM | POA: Diagnosis not present

## 2019-10-05 DIAGNOSIS — H543 Unqualified visual loss, both eyes: Secondary | ICD-10-CM | POA: Diagnosis not present

## 2019-10-05 DIAGNOSIS — R269 Unspecified abnormalities of gait and mobility: Secondary | ICD-10-CM | POA: Diagnosis not present

## 2019-10-05 DIAGNOSIS — H259 Unspecified age-related cataract: Secondary | ICD-10-CM | POA: Diagnosis not present

## 2019-10-05 DIAGNOSIS — I69398 Other sequelae of cerebral infarction: Secondary | ICD-10-CM | POA: Diagnosis not present

## 2019-10-05 DIAGNOSIS — M4125 Other idiopathic scoliosis, thoracolumbar region: Secondary | ICD-10-CM | POA: Diagnosis not present

## 2019-10-05 DIAGNOSIS — G8929 Other chronic pain: Secondary | ICD-10-CM | POA: Diagnosis not present

## 2019-10-07 DIAGNOSIS — R269 Unspecified abnormalities of gait and mobility: Secondary | ICD-10-CM | POA: Diagnosis not present

## 2019-10-07 DIAGNOSIS — M4125 Other idiopathic scoliosis, thoracolumbar region: Secondary | ICD-10-CM | POA: Diagnosis not present

## 2019-10-07 DIAGNOSIS — I69398 Other sequelae of cerebral infarction: Secondary | ICD-10-CM | POA: Diagnosis not present

## 2019-10-07 DIAGNOSIS — H543 Unqualified visual loss, both eyes: Secondary | ICD-10-CM | POA: Diagnosis not present

## 2019-10-07 DIAGNOSIS — G8929 Other chronic pain: Secondary | ICD-10-CM | POA: Diagnosis not present

## 2019-10-07 DIAGNOSIS — H259 Unspecified age-related cataract: Secondary | ICD-10-CM | POA: Diagnosis not present

## 2019-10-08 DIAGNOSIS — H44513 Absolute glaucoma, bilateral: Secondary | ICD-10-CM | POA: Diagnosis not present

## 2019-10-14 DIAGNOSIS — R269 Unspecified abnormalities of gait and mobility: Secondary | ICD-10-CM | POA: Diagnosis not present

## 2019-10-14 DIAGNOSIS — M4125 Other idiopathic scoliosis, thoracolumbar region: Secondary | ICD-10-CM | POA: Diagnosis not present

## 2019-10-14 DIAGNOSIS — H543 Unqualified visual loss, both eyes: Secondary | ICD-10-CM | POA: Diagnosis not present

## 2019-10-14 DIAGNOSIS — G8929 Other chronic pain: Secondary | ICD-10-CM | POA: Diagnosis not present

## 2019-10-14 DIAGNOSIS — I69398 Other sequelae of cerebral infarction: Secondary | ICD-10-CM | POA: Diagnosis not present

## 2019-10-14 DIAGNOSIS — H259 Unspecified age-related cataract: Secondary | ICD-10-CM | POA: Diagnosis not present

## 2019-10-16 DIAGNOSIS — M4125 Other idiopathic scoliosis, thoracolumbar region: Secondary | ICD-10-CM | POA: Diagnosis not present

## 2019-10-16 DIAGNOSIS — H543 Unqualified visual loss, both eyes: Secondary | ICD-10-CM | POA: Diagnosis not present

## 2019-10-16 DIAGNOSIS — G8929 Other chronic pain: Secondary | ICD-10-CM | POA: Diagnosis not present

## 2019-10-16 DIAGNOSIS — R269 Unspecified abnormalities of gait and mobility: Secondary | ICD-10-CM | POA: Diagnosis not present

## 2019-10-16 DIAGNOSIS — I69398 Other sequelae of cerebral infarction: Secondary | ICD-10-CM | POA: Diagnosis not present

## 2019-10-16 DIAGNOSIS — H259 Unspecified age-related cataract: Secondary | ICD-10-CM | POA: Diagnosis not present

## 2019-10-19 DIAGNOSIS — M7712 Lateral epicondylitis, left elbow: Secondary | ICD-10-CM | POA: Diagnosis not present

## 2019-10-19 DIAGNOSIS — Z79891 Long term (current) use of opiate analgesic: Secondary | ICD-10-CM | POA: Diagnosis not present

## 2019-10-19 DIAGNOSIS — F418 Other specified anxiety disorders: Secondary | ICD-10-CM | POA: Diagnosis not present

## 2019-10-19 DIAGNOSIS — R269 Unspecified abnormalities of gait and mobility: Secondary | ICD-10-CM | POA: Diagnosis not present

## 2019-10-19 DIAGNOSIS — R296 Repeated falls: Secondary | ICD-10-CM | POA: Diagnosis not present

## 2019-10-19 DIAGNOSIS — Z993 Dependence on wheelchair: Secondary | ICD-10-CM | POA: Diagnosis not present

## 2019-10-19 DIAGNOSIS — F39 Unspecified mood [affective] disorder: Secondary | ICD-10-CM | POA: Diagnosis not present

## 2019-10-19 DIAGNOSIS — G8929 Other chronic pain: Secondary | ICD-10-CM | POA: Diagnosis not present

## 2019-10-19 DIAGNOSIS — F79 Unspecified intellectual disabilities: Secondary | ICD-10-CM | POA: Diagnosis not present

## 2019-10-19 DIAGNOSIS — H543 Unqualified visual loss, both eyes: Secondary | ICD-10-CM | POA: Diagnosis not present

## 2019-10-19 DIAGNOSIS — I10 Essential (primary) hypertension: Secondary | ICD-10-CM | POA: Diagnosis not present

## 2019-10-19 DIAGNOSIS — H259 Unspecified age-related cataract: Secondary | ICD-10-CM | POA: Diagnosis not present

## 2019-10-19 DIAGNOSIS — R413 Other amnesia: Secondary | ICD-10-CM | POA: Diagnosis not present

## 2019-10-19 DIAGNOSIS — M4125 Other idiopathic scoliosis, thoracolumbar region: Secondary | ICD-10-CM | POA: Diagnosis not present

## 2019-10-19 DIAGNOSIS — Z9181 History of falling: Secondary | ICD-10-CM | POA: Diagnosis not present

## 2019-10-19 DIAGNOSIS — M7730 Calcaneal spur, unspecified foot: Secondary | ICD-10-CM | POA: Diagnosis not present

## 2019-10-19 DIAGNOSIS — Z791 Long term (current) use of non-steroidal anti-inflammatories (NSAID): Secondary | ICD-10-CM | POA: Diagnosis not present

## 2019-10-19 DIAGNOSIS — I69398 Other sequelae of cerebral infarction: Secondary | ICD-10-CM | POA: Diagnosis not present

## 2019-10-20 DIAGNOSIS — I69398 Other sequelae of cerebral infarction: Secondary | ICD-10-CM | POA: Diagnosis not present

## 2019-10-20 DIAGNOSIS — G8929 Other chronic pain: Secondary | ICD-10-CM | POA: Diagnosis not present

## 2019-10-20 DIAGNOSIS — H259 Unspecified age-related cataract: Secondary | ICD-10-CM | POA: Diagnosis not present

## 2019-10-20 DIAGNOSIS — M4125 Other idiopathic scoliosis, thoracolumbar region: Secondary | ICD-10-CM | POA: Diagnosis not present

## 2019-10-20 DIAGNOSIS — R269 Unspecified abnormalities of gait and mobility: Secondary | ICD-10-CM | POA: Diagnosis not present

## 2019-10-20 DIAGNOSIS — H543 Unqualified visual loss, both eyes: Secondary | ICD-10-CM | POA: Diagnosis not present

## 2019-10-22 DIAGNOSIS — I69398 Other sequelae of cerebral infarction: Secondary | ICD-10-CM | POA: Diagnosis not present

## 2019-10-22 DIAGNOSIS — H259 Unspecified age-related cataract: Secondary | ICD-10-CM | POA: Diagnosis not present

## 2019-10-22 DIAGNOSIS — R269 Unspecified abnormalities of gait and mobility: Secondary | ICD-10-CM | POA: Diagnosis not present

## 2019-10-22 DIAGNOSIS — M4125 Other idiopathic scoliosis, thoracolumbar region: Secondary | ICD-10-CM | POA: Diagnosis not present

## 2019-10-22 DIAGNOSIS — H543 Unqualified visual loss, both eyes: Secondary | ICD-10-CM | POA: Diagnosis not present

## 2019-10-22 DIAGNOSIS — G8929 Other chronic pain: Secondary | ICD-10-CM | POA: Diagnosis not present

## 2019-10-23 DIAGNOSIS — G8929 Other chronic pain: Secondary | ICD-10-CM | POA: Diagnosis not present

## 2019-10-23 DIAGNOSIS — M4125 Other idiopathic scoliosis, thoracolumbar region: Secondary | ICD-10-CM | POA: Diagnosis not present

## 2019-10-23 DIAGNOSIS — H543 Unqualified visual loss, both eyes: Secondary | ICD-10-CM | POA: Diagnosis not present

## 2019-10-23 DIAGNOSIS — I69398 Other sequelae of cerebral infarction: Secondary | ICD-10-CM | POA: Diagnosis not present

## 2019-10-23 DIAGNOSIS — R269 Unspecified abnormalities of gait and mobility: Secondary | ICD-10-CM | POA: Diagnosis not present

## 2019-10-23 DIAGNOSIS — H259 Unspecified age-related cataract: Secondary | ICD-10-CM | POA: Diagnosis not present

## 2019-10-26 DIAGNOSIS — M4125 Other idiopathic scoliosis, thoracolumbar region: Secondary | ICD-10-CM | POA: Diagnosis not present

## 2019-10-26 DIAGNOSIS — R531 Weakness: Secondary | ICD-10-CM | POA: Diagnosis not present

## 2019-10-26 DIAGNOSIS — M545 Low back pain: Secondary | ICD-10-CM | POA: Diagnosis not present

## 2019-10-26 DIAGNOSIS — G8929 Other chronic pain: Secondary | ICD-10-CM | POA: Diagnosis not present

## 2019-10-26 DIAGNOSIS — F79 Unspecified intellectual disabilities: Secondary | ICD-10-CM | POA: Diagnosis not present

## 2019-10-26 DIAGNOSIS — R2681 Unsteadiness on feet: Secondary | ICD-10-CM | POA: Diagnosis not present

## 2019-10-26 DIAGNOSIS — W1830XA Fall on same level, unspecified, initial encounter: Secondary | ICD-10-CM | POA: Diagnosis not present

## 2019-10-26 DIAGNOSIS — M6281 Muscle weakness (generalized): Secondary | ICD-10-CM | POA: Diagnosis not present

## 2019-10-26 DIAGNOSIS — R269 Unspecified abnormalities of gait and mobility: Secondary | ICD-10-CM | POA: Diagnosis not present

## 2019-10-27 DIAGNOSIS — M4125 Other idiopathic scoliosis, thoracolumbar region: Secondary | ICD-10-CM | POA: Diagnosis not present

## 2019-10-27 DIAGNOSIS — R269 Unspecified abnormalities of gait and mobility: Secondary | ICD-10-CM | POA: Diagnosis not present

## 2019-10-27 DIAGNOSIS — G8929 Other chronic pain: Secondary | ICD-10-CM | POA: Diagnosis not present

## 2019-10-27 DIAGNOSIS — H259 Unspecified age-related cataract: Secondary | ICD-10-CM | POA: Diagnosis not present

## 2019-10-27 DIAGNOSIS — H543 Unqualified visual loss, both eyes: Secondary | ICD-10-CM | POA: Diagnosis not present

## 2019-10-27 DIAGNOSIS — I69398 Other sequelae of cerebral infarction: Secondary | ICD-10-CM | POA: Diagnosis not present

## 2019-10-28 DIAGNOSIS — H259 Unspecified age-related cataract: Secondary | ICD-10-CM | POA: Diagnosis not present

## 2019-10-28 DIAGNOSIS — G8929 Other chronic pain: Secondary | ICD-10-CM | POA: Diagnosis not present

## 2019-10-28 DIAGNOSIS — R269 Unspecified abnormalities of gait and mobility: Secondary | ICD-10-CM | POA: Diagnosis not present

## 2019-10-28 DIAGNOSIS — M4125 Other idiopathic scoliosis, thoracolumbar region: Secondary | ICD-10-CM | POA: Diagnosis not present

## 2019-10-28 DIAGNOSIS — I69398 Other sequelae of cerebral infarction: Secondary | ICD-10-CM | POA: Diagnosis not present

## 2019-10-28 DIAGNOSIS — H543 Unqualified visual loss, both eyes: Secondary | ICD-10-CM | POA: Diagnosis not present

## 2019-10-29 DIAGNOSIS — H543 Unqualified visual loss, both eyes: Secondary | ICD-10-CM | POA: Diagnosis not present

## 2019-10-29 DIAGNOSIS — M4125 Other idiopathic scoliosis, thoracolumbar region: Secondary | ICD-10-CM | POA: Diagnosis not present

## 2019-10-29 DIAGNOSIS — G8929 Other chronic pain: Secondary | ICD-10-CM | POA: Diagnosis not present

## 2019-10-29 DIAGNOSIS — H259 Unspecified age-related cataract: Secondary | ICD-10-CM | POA: Diagnosis not present

## 2019-10-29 DIAGNOSIS — R269 Unspecified abnormalities of gait and mobility: Secondary | ICD-10-CM | POA: Diagnosis not present

## 2019-10-29 DIAGNOSIS — I69398 Other sequelae of cerebral infarction: Secondary | ICD-10-CM | POA: Diagnosis not present

## 2019-10-30 DIAGNOSIS — M4125 Other idiopathic scoliosis, thoracolumbar region: Secondary | ICD-10-CM | POA: Diagnosis not present

## 2019-10-30 DIAGNOSIS — R269 Unspecified abnormalities of gait and mobility: Secondary | ICD-10-CM | POA: Diagnosis not present

## 2019-10-30 DIAGNOSIS — H543 Unqualified visual loss, both eyes: Secondary | ICD-10-CM | POA: Diagnosis not present

## 2019-10-30 DIAGNOSIS — I69398 Other sequelae of cerebral infarction: Secondary | ICD-10-CM | POA: Diagnosis not present

## 2019-10-30 DIAGNOSIS — G8929 Other chronic pain: Secondary | ICD-10-CM | POA: Diagnosis not present

## 2019-10-30 DIAGNOSIS — H259 Unspecified age-related cataract: Secondary | ICD-10-CM | POA: Diagnosis not present

## 2019-11-02 DIAGNOSIS — I69398 Other sequelae of cerebral infarction: Secondary | ICD-10-CM | POA: Diagnosis not present

## 2019-11-02 DIAGNOSIS — M4125 Other idiopathic scoliosis, thoracolumbar region: Secondary | ICD-10-CM | POA: Diagnosis not present

## 2019-11-02 DIAGNOSIS — H543 Unqualified visual loss, both eyes: Secondary | ICD-10-CM | POA: Diagnosis not present

## 2019-11-02 DIAGNOSIS — H259 Unspecified age-related cataract: Secondary | ICD-10-CM | POA: Diagnosis not present

## 2019-11-02 DIAGNOSIS — G8929 Other chronic pain: Secondary | ICD-10-CM | POA: Diagnosis not present

## 2019-11-02 DIAGNOSIS — R269 Unspecified abnormalities of gait and mobility: Secondary | ICD-10-CM | POA: Diagnosis not present

## 2019-11-03 DIAGNOSIS — G8929 Other chronic pain: Secondary | ICD-10-CM | POA: Diagnosis not present

## 2019-11-03 DIAGNOSIS — M4125 Other idiopathic scoliosis, thoracolumbar region: Secondary | ICD-10-CM | POA: Diagnosis not present

## 2019-11-03 DIAGNOSIS — I69398 Other sequelae of cerebral infarction: Secondary | ICD-10-CM | POA: Diagnosis not present

## 2019-11-03 DIAGNOSIS — H259 Unspecified age-related cataract: Secondary | ICD-10-CM | POA: Diagnosis not present

## 2019-11-03 DIAGNOSIS — H543 Unqualified visual loss, both eyes: Secondary | ICD-10-CM | POA: Diagnosis not present

## 2019-11-03 DIAGNOSIS — R269 Unspecified abnormalities of gait and mobility: Secondary | ICD-10-CM | POA: Diagnosis not present

## 2019-11-04 DIAGNOSIS — I69398 Other sequelae of cerebral infarction: Secondary | ICD-10-CM | POA: Diagnosis not present

## 2019-11-04 DIAGNOSIS — R269 Unspecified abnormalities of gait and mobility: Secondary | ICD-10-CM | POA: Diagnosis not present

## 2019-11-04 DIAGNOSIS — G8929 Other chronic pain: Secondary | ICD-10-CM | POA: Diagnosis not present

## 2019-11-04 DIAGNOSIS — H259 Unspecified age-related cataract: Secondary | ICD-10-CM | POA: Diagnosis not present

## 2019-11-04 DIAGNOSIS — H543 Unqualified visual loss, both eyes: Secondary | ICD-10-CM | POA: Diagnosis not present

## 2019-11-04 DIAGNOSIS — M4125 Other idiopathic scoliosis, thoracolumbar region: Secondary | ICD-10-CM | POA: Diagnosis not present

## 2019-11-05 DIAGNOSIS — G8929 Other chronic pain: Secondary | ICD-10-CM | POA: Diagnosis not present

## 2019-11-05 DIAGNOSIS — H259 Unspecified age-related cataract: Secondary | ICD-10-CM | POA: Diagnosis not present

## 2019-11-05 DIAGNOSIS — I69398 Other sequelae of cerebral infarction: Secondary | ICD-10-CM | POA: Diagnosis not present

## 2019-11-05 DIAGNOSIS — M4125 Other idiopathic scoliosis, thoracolumbar region: Secondary | ICD-10-CM | POA: Diagnosis not present

## 2019-11-05 DIAGNOSIS — R269 Unspecified abnormalities of gait and mobility: Secondary | ICD-10-CM | POA: Diagnosis not present

## 2019-11-05 DIAGNOSIS — H543 Unqualified visual loss, both eyes: Secondary | ICD-10-CM | POA: Diagnosis not present

## 2019-11-09 DIAGNOSIS — H259 Unspecified age-related cataract: Secondary | ICD-10-CM | POA: Diagnosis not present

## 2019-11-09 DIAGNOSIS — H543 Unqualified visual loss, both eyes: Secondary | ICD-10-CM | POA: Diagnosis not present

## 2019-11-09 DIAGNOSIS — I69398 Other sequelae of cerebral infarction: Secondary | ICD-10-CM | POA: Diagnosis not present

## 2019-11-09 DIAGNOSIS — R269 Unspecified abnormalities of gait and mobility: Secondary | ICD-10-CM | POA: Diagnosis not present

## 2019-11-09 DIAGNOSIS — G8929 Other chronic pain: Secondary | ICD-10-CM | POA: Diagnosis not present

## 2019-11-09 DIAGNOSIS — M4125 Other idiopathic scoliosis, thoracolumbar region: Secondary | ICD-10-CM | POA: Diagnosis not present

## 2019-11-12 DIAGNOSIS — M4125 Other idiopathic scoliosis, thoracolumbar region: Secondary | ICD-10-CM | POA: Diagnosis not present

## 2019-11-12 DIAGNOSIS — R269 Unspecified abnormalities of gait and mobility: Secondary | ICD-10-CM | POA: Diagnosis not present

## 2019-11-12 DIAGNOSIS — I69398 Other sequelae of cerebral infarction: Secondary | ICD-10-CM | POA: Diagnosis not present

## 2019-11-12 DIAGNOSIS — G8929 Other chronic pain: Secondary | ICD-10-CM | POA: Diagnosis not present

## 2019-11-12 DIAGNOSIS — H259 Unspecified age-related cataract: Secondary | ICD-10-CM | POA: Diagnosis not present

## 2019-11-12 DIAGNOSIS — H543 Unqualified visual loss, both eyes: Secondary | ICD-10-CM | POA: Diagnosis not present

## 2019-11-17 DIAGNOSIS — R269 Unspecified abnormalities of gait and mobility: Secondary | ICD-10-CM | POA: Diagnosis not present

## 2019-11-17 DIAGNOSIS — M4125 Other idiopathic scoliosis, thoracolumbar region: Secondary | ICD-10-CM | POA: Diagnosis not present

## 2019-11-17 DIAGNOSIS — G8929 Other chronic pain: Secondary | ICD-10-CM | POA: Diagnosis not present

## 2019-11-17 DIAGNOSIS — R509 Fever, unspecified: Secondary | ICD-10-CM | POA: Diagnosis not present

## 2019-11-17 DIAGNOSIS — I10 Essential (primary) hypertension: Secondary | ICD-10-CM | POA: Diagnosis not present

## 2019-11-17 DIAGNOSIS — Z23 Encounter for immunization: Secondary | ICD-10-CM | POA: Diagnosis not present

## 2019-11-17 DIAGNOSIS — M6281 Muscle weakness (generalized): Secondary | ICD-10-CM | POA: Diagnosis not present

## 2019-11-18 DIAGNOSIS — R413 Other amnesia: Secondary | ICD-10-CM | POA: Diagnosis not present

## 2019-11-18 DIAGNOSIS — M4125 Other idiopathic scoliosis, thoracolumbar region: Secondary | ICD-10-CM | POA: Diagnosis not present

## 2019-11-18 DIAGNOSIS — F39 Unspecified mood [affective] disorder: Secondary | ICD-10-CM | POA: Diagnosis not present

## 2019-11-18 DIAGNOSIS — Z791 Long term (current) use of non-steroidal anti-inflammatories (NSAID): Secondary | ICD-10-CM | POA: Diagnosis not present

## 2019-11-18 DIAGNOSIS — Z993 Dependence on wheelchair: Secondary | ICD-10-CM | POA: Diagnosis not present

## 2019-11-18 DIAGNOSIS — Z79891 Long term (current) use of opiate analgesic: Secondary | ICD-10-CM | POA: Diagnosis not present

## 2019-11-18 DIAGNOSIS — M7730 Calcaneal spur, unspecified foot: Secondary | ICD-10-CM | POA: Diagnosis not present

## 2019-11-18 DIAGNOSIS — I69398 Other sequelae of cerebral infarction: Secondary | ICD-10-CM | POA: Diagnosis not present

## 2019-11-18 DIAGNOSIS — G8929 Other chronic pain: Secondary | ICD-10-CM | POA: Diagnosis not present

## 2019-11-18 DIAGNOSIS — H543 Unqualified visual loss, both eyes: Secondary | ICD-10-CM | POA: Diagnosis not present

## 2019-11-18 DIAGNOSIS — I10 Essential (primary) hypertension: Secondary | ICD-10-CM | POA: Diagnosis not present

## 2019-11-18 DIAGNOSIS — R269 Unspecified abnormalities of gait and mobility: Secondary | ICD-10-CM | POA: Diagnosis not present

## 2019-11-18 DIAGNOSIS — F79 Unspecified intellectual disabilities: Secondary | ICD-10-CM | POA: Diagnosis not present

## 2019-11-18 DIAGNOSIS — Z9181 History of falling: Secondary | ICD-10-CM | POA: Diagnosis not present

## 2019-11-18 DIAGNOSIS — R82998 Other abnormal findings in urine: Secondary | ICD-10-CM | POA: Diagnosis not present

## 2019-11-18 DIAGNOSIS — F418 Other specified anxiety disorders: Secondary | ICD-10-CM | POA: Diagnosis not present

## 2019-11-18 DIAGNOSIS — H259 Unspecified age-related cataract: Secondary | ICD-10-CM | POA: Diagnosis not present

## 2019-11-18 DIAGNOSIS — M7712 Lateral epicondylitis, left elbow: Secondary | ICD-10-CM | POA: Diagnosis not present

## 2019-11-18 DIAGNOSIS — R296 Repeated falls: Secondary | ICD-10-CM | POA: Diagnosis not present

## 2019-11-19 DIAGNOSIS — I69398 Other sequelae of cerebral infarction: Secondary | ICD-10-CM | POA: Diagnosis not present

## 2019-11-19 DIAGNOSIS — H543 Unqualified visual loss, both eyes: Secondary | ICD-10-CM | POA: Diagnosis not present

## 2019-11-19 DIAGNOSIS — M4125 Other idiopathic scoliosis, thoracolumbar region: Secondary | ICD-10-CM | POA: Diagnosis not present

## 2019-11-19 DIAGNOSIS — H259 Unspecified age-related cataract: Secondary | ICD-10-CM | POA: Diagnosis not present

## 2019-11-19 DIAGNOSIS — R269 Unspecified abnormalities of gait and mobility: Secondary | ICD-10-CM | POA: Diagnosis not present

## 2019-11-19 DIAGNOSIS — G8929 Other chronic pain: Secondary | ICD-10-CM | POA: Diagnosis not present

## 2019-11-20 DIAGNOSIS — G8929 Other chronic pain: Secondary | ICD-10-CM | POA: Diagnosis not present

## 2019-11-20 DIAGNOSIS — H543 Unqualified visual loss, both eyes: Secondary | ICD-10-CM | POA: Diagnosis not present

## 2019-11-20 DIAGNOSIS — I69398 Other sequelae of cerebral infarction: Secondary | ICD-10-CM | POA: Diagnosis not present

## 2019-11-20 DIAGNOSIS — H259 Unspecified age-related cataract: Secondary | ICD-10-CM | POA: Diagnosis not present

## 2019-11-20 DIAGNOSIS — R269 Unspecified abnormalities of gait and mobility: Secondary | ICD-10-CM | POA: Diagnosis not present

## 2019-11-20 DIAGNOSIS — M4125 Other idiopathic scoliosis, thoracolumbar region: Secondary | ICD-10-CM | POA: Diagnosis not present

## 2019-11-23 DIAGNOSIS — I69398 Other sequelae of cerebral infarction: Secondary | ICD-10-CM | POA: Diagnosis not present

## 2019-11-23 DIAGNOSIS — H259 Unspecified age-related cataract: Secondary | ICD-10-CM | POA: Diagnosis not present

## 2019-11-23 DIAGNOSIS — H543 Unqualified visual loss, both eyes: Secondary | ICD-10-CM | POA: Diagnosis not present

## 2019-11-23 DIAGNOSIS — R269 Unspecified abnormalities of gait and mobility: Secondary | ICD-10-CM | POA: Diagnosis not present

## 2019-11-23 DIAGNOSIS — G8929 Other chronic pain: Secondary | ICD-10-CM | POA: Diagnosis not present

## 2019-11-23 DIAGNOSIS — M4125 Other idiopathic scoliosis, thoracolumbar region: Secondary | ICD-10-CM | POA: Diagnosis not present

## 2019-11-25 DIAGNOSIS — M4125 Other idiopathic scoliosis, thoracolumbar region: Secondary | ICD-10-CM | POA: Diagnosis not present

## 2019-11-25 DIAGNOSIS — I69398 Other sequelae of cerebral infarction: Secondary | ICD-10-CM | POA: Diagnosis not present

## 2019-11-25 DIAGNOSIS — G8929 Other chronic pain: Secondary | ICD-10-CM | POA: Diagnosis not present

## 2019-11-25 DIAGNOSIS — R269 Unspecified abnormalities of gait and mobility: Secondary | ICD-10-CM | POA: Diagnosis not present

## 2019-11-25 DIAGNOSIS — H543 Unqualified visual loss, both eyes: Secondary | ICD-10-CM | POA: Diagnosis not present

## 2019-11-25 DIAGNOSIS — H259 Unspecified age-related cataract: Secondary | ICD-10-CM | POA: Diagnosis not present

## 2019-12-01 DIAGNOSIS — H259 Unspecified age-related cataract: Secondary | ICD-10-CM | POA: Diagnosis not present

## 2019-12-01 DIAGNOSIS — H543 Unqualified visual loss, both eyes: Secondary | ICD-10-CM | POA: Diagnosis not present

## 2019-12-01 DIAGNOSIS — I69398 Other sequelae of cerebral infarction: Secondary | ICD-10-CM | POA: Diagnosis not present

## 2019-12-01 DIAGNOSIS — R269 Unspecified abnormalities of gait and mobility: Secondary | ICD-10-CM | POA: Diagnosis not present

## 2019-12-01 DIAGNOSIS — M4125 Other idiopathic scoliosis, thoracolumbar region: Secondary | ICD-10-CM | POA: Diagnosis not present

## 2019-12-01 DIAGNOSIS — G8929 Other chronic pain: Secondary | ICD-10-CM | POA: Diagnosis not present

## 2019-12-03 DIAGNOSIS — G8929 Other chronic pain: Secondary | ICD-10-CM | POA: Diagnosis not present

## 2019-12-03 DIAGNOSIS — R269 Unspecified abnormalities of gait and mobility: Secondary | ICD-10-CM | POA: Diagnosis not present

## 2019-12-03 DIAGNOSIS — H259 Unspecified age-related cataract: Secondary | ICD-10-CM | POA: Diagnosis not present

## 2019-12-03 DIAGNOSIS — I69398 Other sequelae of cerebral infarction: Secondary | ICD-10-CM | POA: Diagnosis not present

## 2019-12-03 DIAGNOSIS — H543 Unqualified visual loss, both eyes: Secondary | ICD-10-CM | POA: Diagnosis not present

## 2019-12-03 DIAGNOSIS — M4125 Other idiopathic scoliosis, thoracolumbar region: Secondary | ICD-10-CM | POA: Diagnosis not present

## 2019-12-08 DIAGNOSIS — H543 Unqualified visual loss, both eyes: Secondary | ICD-10-CM | POA: Diagnosis not present

## 2019-12-08 DIAGNOSIS — Z23 Encounter for immunization: Secondary | ICD-10-CM | POA: Diagnosis not present

## 2019-12-08 DIAGNOSIS — I69398 Other sequelae of cerebral infarction: Secondary | ICD-10-CM | POA: Diagnosis not present

## 2019-12-08 DIAGNOSIS — R269 Unspecified abnormalities of gait and mobility: Secondary | ICD-10-CM | POA: Diagnosis not present

## 2019-12-08 DIAGNOSIS — H259 Unspecified age-related cataract: Secondary | ICD-10-CM | POA: Diagnosis not present

## 2019-12-08 DIAGNOSIS — M4125 Other idiopathic scoliosis, thoracolumbar region: Secondary | ICD-10-CM | POA: Diagnosis not present

## 2019-12-08 DIAGNOSIS — G8929 Other chronic pain: Secondary | ICD-10-CM | POA: Diagnosis not present

## 2019-12-10 DIAGNOSIS — R269 Unspecified abnormalities of gait and mobility: Secondary | ICD-10-CM | POA: Diagnosis not present

## 2019-12-10 DIAGNOSIS — I69398 Other sequelae of cerebral infarction: Secondary | ICD-10-CM | POA: Diagnosis not present

## 2019-12-10 DIAGNOSIS — H543 Unqualified visual loss, both eyes: Secondary | ICD-10-CM | POA: Diagnosis not present

## 2019-12-10 DIAGNOSIS — G8929 Other chronic pain: Secondary | ICD-10-CM | POA: Diagnosis not present

## 2019-12-10 DIAGNOSIS — M4125 Other idiopathic scoliosis, thoracolumbar region: Secondary | ICD-10-CM | POA: Diagnosis not present

## 2019-12-10 DIAGNOSIS — H44513 Absolute glaucoma, bilateral: Secondary | ICD-10-CM | POA: Diagnosis not present

## 2019-12-10 DIAGNOSIS — H259 Unspecified age-related cataract: Secondary | ICD-10-CM | POA: Diagnosis not present

## 2019-12-13 DIAGNOSIS — Z20822 Contact with and (suspected) exposure to covid-19: Secondary | ICD-10-CM | POA: Diagnosis not present

## 2019-12-16 DIAGNOSIS — R269 Unspecified abnormalities of gait and mobility: Secondary | ICD-10-CM | POA: Diagnosis not present

## 2019-12-16 DIAGNOSIS — M4125 Other idiopathic scoliosis, thoracolumbar region: Secondary | ICD-10-CM | POA: Diagnosis not present

## 2019-12-16 DIAGNOSIS — I69398 Other sequelae of cerebral infarction: Secondary | ICD-10-CM | POA: Diagnosis not present

## 2019-12-16 DIAGNOSIS — H543 Unqualified visual loss, both eyes: Secondary | ICD-10-CM | POA: Diagnosis not present

## 2019-12-16 DIAGNOSIS — H259 Unspecified age-related cataract: Secondary | ICD-10-CM | POA: Diagnosis not present

## 2019-12-16 DIAGNOSIS — G8929 Other chronic pain: Secondary | ICD-10-CM | POA: Diagnosis not present

## 2019-12-17 DIAGNOSIS — R269 Unspecified abnormalities of gait and mobility: Secondary | ICD-10-CM | POA: Diagnosis not present

## 2019-12-17 DIAGNOSIS — M4125 Other idiopathic scoliosis, thoracolumbar region: Secondary | ICD-10-CM | POA: Diagnosis not present

## 2019-12-17 DIAGNOSIS — I69398 Other sequelae of cerebral infarction: Secondary | ICD-10-CM | POA: Diagnosis not present

## 2019-12-17 DIAGNOSIS — H259 Unspecified age-related cataract: Secondary | ICD-10-CM | POA: Diagnosis not present

## 2019-12-17 DIAGNOSIS — H543 Unqualified visual loss, both eyes: Secondary | ICD-10-CM | POA: Diagnosis not present

## 2019-12-17 DIAGNOSIS — G8929 Other chronic pain: Secondary | ICD-10-CM | POA: Diagnosis not present

## 2019-12-20 ENCOUNTER — Emergency Department (HOSPITAL_COMMUNITY)
Admission: EM | Admit: 2019-12-20 | Discharge: 2019-12-20 | Disposition: A | Discharge status: Skilled Nursing Facility | Payer: Medicare Other | Attending: Emergency Medicine | Admitting: Emergency Medicine

## 2019-12-20 ENCOUNTER — Emergency Department (HOSPITAL_COMMUNITY): Payer: Medicare Other

## 2019-12-20 ENCOUNTER — Other Ambulatory Visit: Payer: Self-pay

## 2019-12-20 DIAGNOSIS — S80212A Abrasion, left knee, initial encounter: Secondary | ICD-10-CM | POA: Insufficient documentation

## 2019-12-20 DIAGNOSIS — R52 Pain, unspecified: Secondary | ICD-10-CM | POA: Diagnosis not present

## 2019-12-20 DIAGNOSIS — R4182 Altered mental status, unspecified: Secondary | ICD-10-CM | POA: Diagnosis not present

## 2019-12-20 DIAGNOSIS — M25511 Pain in right shoulder: Secondary | ICD-10-CM | POA: Insufficient documentation

## 2019-12-20 DIAGNOSIS — Z20822 Contact with and (suspected) exposure to covid-19: Secondary | ICD-10-CM | POA: Insufficient documentation

## 2019-12-20 DIAGNOSIS — M1711 Unilateral primary osteoarthritis, right knee: Secondary | ICD-10-CM | POA: Diagnosis not present

## 2019-12-20 DIAGNOSIS — Z7401 Bed confinement status: Secondary | ICD-10-CM | POA: Diagnosis not present

## 2019-12-20 DIAGNOSIS — S199XXA Unspecified injury of neck, initial encounter: Secondary | ICD-10-CM | POA: Diagnosis not present

## 2019-12-20 DIAGNOSIS — S80211A Abrasion, right knee, initial encounter: Secondary | ICD-10-CM | POA: Insufficient documentation

## 2019-12-20 DIAGNOSIS — M4802 Spinal stenosis, cervical region: Secondary | ICD-10-CM | POA: Diagnosis not present

## 2019-12-20 DIAGNOSIS — M2550 Pain in unspecified joint: Secondary | ICD-10-CM | POA: Diagnosis not present

## 2019-12-20 DIAGNOSIS — S0990XA Unspecified injury of head, initial encounter: Secondary | ICD-10-CM | POA: Diagnosis not present

## 2019-12-20 DIAGNOSIS — W19XXXA Unspecified fall, initial encounter: Secondary | ICD-10-CM | POA: Insufficient documentation

## 2019-12-20 DIAGNOSIS — S0081XA Abrasion of other part of head, initial encounter: Secondary | ICD-10-CM | POA: Insufficient documentation

## 2019-12-20 DIAGNOSIS — G8929 Other chronic pain: Secondary | ICD-10-CM | POA: Diagnosis not present

## 2019-12-20 DIAGNOSIS — R5381 Other malaise: Secondary | ICD-10-CM | POA: Diagnosis not present

## 2019-12-20 DIAGNOSIS — R0902 Hypoxemia: Secondary | ICD-10-CM | POA: Diagnosis not present

## 2019-12-20 LAB — RESPIRATORY PANEL BY RT PCR (FLU A&B, COVID)
Influenza A by PCR: NEGATIVE
Influenza B by PCR: NEGATIVE
SARS Coronavirus 2 by RT PCR: NEGATIVE

## 2019-12-20 MED ORDER — LORAZEPAM 2 MG/ML IJ SOLN
1.0000 mg | Freq: Once | INTRAMUSCULAR | Status: AC
  Administered 2019-12-20: 1 mg via INTRAVENOUS
  Filled 2019-12-20: qty 1

## 2019-12-20 NOTE — ED Triage Notes (Signed)
Pt arrived from [facility], from [facility] facility. Facility did not provide any paper work for pt. Pt fell a days ago and fell again this am. Pt has abrasions to both knees and chin. Falls have been unwitnessed.  Vitals  130/ pal 94 RA 100 P

## 2019-12-20 NOTE — ED Notes (Signed)
Called [facility] spoke with  Jon Gills to let her know pt would be coming back to facility.

## 2019-12-20 NOTE — ED Notes (Signed)
Pt in bed  Resting, son at bedside, respirations even and unlabored

## 2019-12-20 NOTE — Discharge Instructions (Signed)
Your head CT and knee x-rays were reassuring today, no fractures or acute abnormalities but patient does have severe osteoarthritis of the knee.  CT scans of the cervical spine show concern for severe cervical spinal stenosis, MRIs reflect this as well and do show some possible cord compression, I discussed this with Dr. Johnsie Cancel with neurosurgery who would like to see you in his office for follow-up this week.  Please call to schedule follow-up appointment.

## 2019-12-20 NOTE — ED Notes (Signed)
Pt has bed alarm on and family member at bedside. Pt is sleeping at this time respirations even and unlabored. Will continue to monitor.

## 2019-12-20 NOTE — ED Notes (Signed)
PTAR  Called no eta

## 2019-12-20 NOTE — Progress Notes (Signed)
Patient unable to provide medical history. Spoke with patient's son for MRI clearance.

## 2019-12-20 NOTE — ED Provider Notes (Signed)
[facility]-EMERGENCY DEPT Provider Note   CSN: [id number] Arrival date & time: 12/20/19  0820     History Chief Complaint  Patient presents with  . Fall    Ted Goodner is a 69 y.o. male.  Igor Bishop is a 70 y.o. male who presents via EMS from [facility] skilled nursing facility for evaluation of 2 unwitnessed falls.  Is [place] speaking but is accompanied by his brother who helps provide history.  Patient's brother was his primary caretaker prior to the patient going to the skilled nursing facility on Thursday of this week.  He had his first fall on Friday which was unwitnessed but staff found him on the ground he did not complain of any pain, they report that he fell again this morning it was again unwitnessed, but they wanted him to come and get evaluated.  The brother states that the patient has a hard time pressing the call button, because he is visually and hearing impaired and has a hard time locating the button.  He states that he calls out when he needs to go to the bathroom, but if someone does not, he will try and get up on his own and his brother suspects that that is how he has been falling.  Patient has not complained of any pain.  He does have some small abrasions to his knees which further state were initially present on Friday but got worse after second fall today.  They do not know if he hit his head.  Patient denies headache.  Patient's brother states that he is at his baseline mental status and acting per usual.  Brother states that over the past year patient has had a general decline in his ability to function on his own, his brother was taking care of him and helping him get up to the wheelchair and to the bathroom throughout the day but over the past few months patient has had increasing difficulty even helping to transfer from the bed to chair, they were doing home PT but he was not improving that prompted his admission to [facility] skilled nursing  facility.  Brother has been visiting and checking on him regularly, states no fevers or illness and no other concerns.        No past medical history on file.  There are no problems to display for this patient.     No family history on file.  Social History   Tobacco Use  . Smoking status: Not on file  Substance Use Topics  . Alcohol use: Not on file  . Drug use: Not on file    Home Medications Prior to Admission medications   Medication Sig Start Date End Date Taking? Authorizing Provider  acetaminophen (TYLENOL) 500 MG tablet Take 1,000 mg by mouth in the morning and at bedtime.   Yes [provider]  CARTIA XT 120 MG 24 hr capsule Take 120 mg by mouth daily. 09/24/19  Yes [provider]  cetirizine (ZYRTEC) 10 MG tablet Take 10 mg by mouth daily.   Yes [provider]  diclofenac Sodium (VOLTAREN) 1 % GEL Apply 2 g topically daily as needed (lower back pain).   Yes [provider]  dorzolamide-timolol (COSOPT) 22.3-6.8 MG/ML ophthalmic solution Place 1 drop into the right eye daily.   Yes [provider]  escitalopram (LEXAPRO) 10 MG tablet Take 10 mg by mouth daily.   Yes [provider]  famotidine (PEPCID) 20 MG tablet Take 20 mg  by mouth daily.   Yes [provider]  HYDROcodone-acetaminophen (NORCO/VICODIN) 5-325 MG tablet Take 1 tablet by mouth daily as needed for moderate pain.   Yes [provider]  hydrOXYzine (ATARAX/VISTARIL) 25 MG tablet Take 25 mg by mouth every 8 (eight) hours as needed for itching.   Yes [provider]  meloxicam (MOBIC) 15 MG tablet Take 15 mg by mouth daily.   Yes [provider]  methocarbamol (ROBAXIN) 500 MG tablet Take 500 mg by mouth every 8 (eight) hours as needed for muscle spasms.   Yes [provider]  Multiple Vitamin (MULTIVITAMIN WITH MINERALS) TABS tablet Take 1 tablet by mouth daily.   Yes [provider]  oxybutynin  (DITROPAN-XL) 10 MG 24 hr tablet Take 10 mg by mouth at bedtime.   Yes [provider]  polyethylene glycol (MIRALAX / GLYCOLAX) 17 g packet Take 17 g by mouth daily.   Yes [provider]  Probiotic Product (PROBIOTIC-10 ULTIMATE) CAPS Take 1 capsule by mouth daily.   Yes [provider]  risperiDONE (RISPERDAL) 2 MG tablet Take 2 mg by mouth at bedtime.   Yes [provider]  tamsulosin (FLOMAX) 0.4 MG CAPS capsule Take 0.4 mg by mouth daily.   Yes [provider]  Wheat Dextrin (BENEFIBER PO) Take 1 Scoop by mouth daily.   Yes [provider]    Allergies    Sulfa antibiotics  Review of Systems   Review of Systems  Constitutional: Negative for chills and fever.  HENT: Negative.   Respiratory: Negative for cough and shortness of breath.   Cardiovascular: Negative for chest pain.  Gastrointestinal: Negative for abdominal pain, nausea and vomiting.  Musculoskeletal: Negative for arthralgias and myalgias.  Skin: Positive for wound.  Neurological: Negative for dizziness, syncope, light-headedness and headaches.  All other systems reviewed and are negative.   Physical Exam Updated Vital Signs BP 125/79   Pulse 86   Temp 98.3 F (36.8 C)   Resp 18   Ht  (1.575 m)   Wt 70.3 kg   SpO2 97%   BMI 28.35 kg/m   Physical Exam Vitals and nursing note reviewed.  Constitutional:      General: He is not in acute distress.    Appearance: He is well-developed. He is not diaphoretic.     Comments: Elderly gentleman laying on stretcher in no acute dress  HENT:     Head: Normocephalic and atraumatic.     Comments: There is a small abrasion to the left side of the forehead, no hematomas, step-off or other deformity or sign of head trauma Eyes:     General:        Right eye: No discharge.        Left eye: No discharge.     Comments: Pt is blind  Neck:     Comments: Pt denies pain with palpation over the neck Cardiovascular:      Rate and Rhythm: Normal rate and regular rhythm.     Heart sounds: Normal heart sounds. No murmur heard.  No friction rub. No gallop.   Pulmonary:     Effort: Pulmonary effort is normal. No respiratory distress.     Breath sounds: Normal breath sounds. No wheezing or rales.     Comments: Respirations equal and unlabored, patient able to speak in full sentences, lungs clear to auscultation bilaterally, chest wall NTTP Abdominal:     General: Bowel sounds are normal. There is no distension.  Palpations: Abdomen is soft. There is no mass.     Tenderness: There is no abdominal tenderness. There is no guarding.  Musculoskeletal:        General: No deformity.     Cervical back: Neck supple.     Comments: No midline focal tenderness over the thoracic or lumbar spine. There is abrasions over both knees, worse on the right than left, mild tenderness in the right knee, normal range of motion, no tenderness over the hips or pelvis.  No tenderness over the right upper extremity.  Patient does have some chronic right shoulder pain which is unchanged and no worse than usual.  Skin:    General: Skin is warm and dry.     Capillary Refill: Capillary refill takes less than 2 seconds.  Neurological:     Mental Status: He is alert.     Coordination: Coordination normal.     Comments: Speech is clear, able to follow commands CN grossly intact Normal strength in bilateral upper and lower extremities Sensation intact in all extremities. Moves extremities without ataxia, coordination intact   Psychiatric:        Mood and Affect: Mood normal.        Behavior: Behavior normal.     ED Results / Procedures / Treatments   Labs (all labs ordered are listed, but only abnormal results are displayed) Labs Reviewed  RESPIRATORY PANEL BY RT PCR (FLU A&B, COVID)    EKG None  Radiology CT Head Wo Contrast  Result Date: 12/20/2019 CLINICAL DATA:  Larey SeatFell a few days ago and again this morning, minor head  trauma, unwitnessed falls EXAM: CT HEAD WITHOUT CONTRAST CT CERVICAL SPINE WITHOUT CONTRAST TECHNIQUE: Multidetector CT imaging of the head and cervical spine was performed following the standard protocol without intravenous contrast. Multiplanar CT image reconstructions of the cervical spine were also generated. COMPARISON:  None FINDINGS: CT HEAD FINDINGS Brain: Generalized atrophy. Normal ventricular morphology. No midline shift or mass effect. Mild small vessel chronic ischemic changes of deep cerebral white matter. No intracranial hemorrhage, mass lesion, or evidence of acute infarction. No extra-axial fluid collections. Vascular: No hyperdense vessels. Mild atherosclerotic calcifications of internal carotid arteries at skull base Skull: Intact. Significant narrowing of the craniocervical junction/foramen magnum, see below Sinuses/Orbits: Mild scattered mucosal thickening paranasal sinuses. Other: N/A CT CERVICAL SPINE FINDINGS Alignment: Anterolisthesis of C1 on C2. Superior subluxation of odontoid process extending into foramen magnum. Additional anterolisthesis C4-C5 approximately 4 mm. Mild retrolisthesis C6-C7. 5 mm anterolisthesis C7-T1. Skull base and vertebrae: Osseous demineralization. Severe multilevel facet degenerative changes. Significant C1-C2 degenerative changes. Severe multilevel degenerative disc disease changes C4-C5 through T1-T2. Marked height loss of C7 vertebral body. No acute fracture identified. Reversal of cervical lordosis. Soft tissues and spinal canal: Prevertebral soft tissues normal thickness. Marked AP narrowing of spinal canal at C1-C2, approximately 8 mm residual diameter, may be compressing spinal cord. Lesser degrees of spinal stenosis identified from C4-C5 to C7-T1. Disc levels:  Bulky endplate spurs at [id number]-C6 and C6-C7 Upper chest: Minimal biapical scarring Other: N/A IMPRESSION: Atrophy with mild small vessel chronic ischemic changes of deep cerebral white matter. No acute  intracranial abnormalities. Severe multilevel degenerative disc and facet disease changes of the cervical spine as above. Anterolisthesis of C1 on C2, with superior subluxation of odontoid process extending into foramen magnum consistent with basilar invagination. Marked AP narrowing of spinal canal at C1-C2, approximately 8 mm residual diameter, may be compressing spinal cord, with lesser degrees of spinal stenosis  identified at C4-C5 through C7-T1. No acute cervical spine injury identified. Findings called to Jodi Geralds PA on 12/20/2019 at 1110 hrs. Electronically Signed   By: Ulyses Southward M.D.   On: 12/20/2019 11:09   CT Cervical Spine Wo Contrast  Result Date: 12/20/2019 CLINICAL DATA:  Larey Seat a few days ago and again this morning, minor head trauma, unwitnessed falls EXAM: CT HEAD WITHOUT CONTRAST CT CERVICAL SPINE WITHOUT CONTRAST TECHNIQUE: Multidetector CT imaging of the head and cervical spine was performed following the standard protocol without intravenous contrast. Multiplanar CT image reconstructions of the cervical spine were also generated. COMPARISON:  None FINDINGS: CT HEAD FINDINGS Brain: Generalized atrophy. Normal ventricular morphology. No midline shift or mass effect. Mild small vessel chronic ischemic changes of deep cerebral white matter. No intracranial hemorrhage, mass lesion, or evidence of acute infarction. No extra-axial fluid collections. Vascular: No hyperdense vessels. Mild atherosclerotic calcifications of internal carotid arteries at skull base Skull: Intact. Significant narrowing of the craniocervical junction/foramen magnum, see below Sinuses/Orbits: Mild scattered mucosal thickening paranasal sinuses. Other: N/A CT CERVICAL SPINE FINDINGS Alignment: Anterolisthesis of C1 on C2. Superior subluxation of odontoid process extending into foramen magnum. Additional anterolisthesis C4-C5 approximately 4 mm. Mild retrolisthesis C6-C7. 5 mm anterolisthesis C7-T1. Skull base and  vertebrae: Osseous demineralization. Severe multilevel facet degenerative changes. Significant C1-C2 degenerative changes. Severe multilevel degenerative disc disease changes C4-C5 through T1-T2. Marked height loss of C7 vertebral body. No acute fracture identified. Reversal of cervical lordosis. Soft tissues and spinal canal: Prevertebral soft tissues normal thickness. Marked AP narrowing of spinal canal at C1-C2, approximately 8 mm residual diameter, may be compressing spinal cord. Lesser degrees of spinal stenosis identified from C4-C5 to C7-T1. Disc levels:  Bulky endplate spurs at X1-G6 and C6-C7 Upper chest: Minimal biapical scarring Other: N/A IMPRESSION: Atrophy with mild small vessel chronic ischemic changes of deep cerebral white matter. No acute intracranial abnormalities. Severe multilevel degenerative disc and facet disease changes of the cervical spine as above. Anterolisthesis of C1 on C2, with superior subluxation of odontoid process extending into foramen magnum consistent with basilar invagination. Marked AP narrowing of spinal canal at C1-C2, approximately 8 mm residual diameter, may be compressing spinal cord, with lesser degrees of spinal stenosis identified at C4-C5 through C7-T1. No acute cervical spine injury identified. Findings called to Jodi Geralds PA on 12/20/2019 at 1110 hrs. Electronically Signed   By: Ulyses Southward M.D.   On: 12/20/2019 11:09   MR Cervical Spine Wo Contrast  Addendum Date: 12/20/2019   ADDENDUM REPORT: 12/20/2019 14:48 ADDENDUM: Finding omitted from the impression: signal abnormality within the visualized cervical left vertebral artery suggestive of high-grade stenosis or occlusion. Consider CT or MR angiography for further evaluation. This addendum was called by telephone at the time of interpretation on 12/20/2019 at 2:45 pm to provider [facility] , who verbally acknowledged these results. Electronically Signed   By: Jackey Loge DO   On: 12/20/2019 14:48    Result Date: 12/20/2019 CLINICAL DATA:  Spinal stenosis, cervical spine. EXAM: MRI CERVICAL SPINE WITHOUT CONTRAST TECHNIQUE: Multiplanar, multisequence MR imaging of the cervical spine was performed. No intravenous contrast was administered. COMPARISON:  CT head/cervical spine performed earlier the same day 12/20/2019. FINDINGS: Alignment: Redemonstrated basilar invagination. The tip of the dens extends above McRae line by 9 mm. There is anterior subluxation of C1 upon C2 with the anterior atlantodental interval measuring 7 mm. This may be partially related to a osteophyte projecting ventrally from the anterior aspect of  the dens. Mild C2-C3 and C3-C4 grade 1 anterolisthesis. 4 mm C4-C5 grade 1 anterolisthesis. Vertebrae: There is advanced chronic C7 vertebral body height loss. Less severe chronic height loss of the T1 vertebral body. Mild marrow edema within the C6 and C7 vertebral bodies, likely degenerative. Multilevel degenerative endplate irregularity. Suspected degenerative fusion across the C5-C6 disc space anteriorly. Cord: See description under the C1-C2, C2-C3 and C4-C5 levels below. Posterior Fossa, vertebral arteries, paraspinal tissues: No abnormality identified within included portions of the posterior fossa. Signal abnormality within the visualized cervical left vertebral artery suggestive of high-grade stenosis or occlusion. Paraspinal soft tissues within normal limits. Disc levels: Multilevel disc degeneration. Most notably, there is severe disc degeneration at C4-C5, C5-C6, C6-C7, C7-T1 and T1-T2. Skull base and C1-C2 level: Prominent degenerative changes at the C1-C2 articulation with ligamentous hypertrophy/pannus formation. In combination with basilar invagination and C1-C2 anterior subluxation, there is severe spinal canal stenosis with significant spinal cord flattening at the C2 vertebral body level. Subtle T2 hyperintense signal abnormality is questioned within the upper cervical spinal  cord at this level, which may reflect myelomalacia or focal edema. C2-C3: Grade 1 anterolisthesis. Posterior disc osteophyte complex. Uncovertebral, facet and ligamentum flavum hypertrophy. Severe spinal canal stenosis with significant spinal cord flattening. Subtle T2 hyperintense signal abnormality is questioned within the spinal cord at this level, which may reflect myelomalacia or focal edema. Bilateral neural foraminal narrowing (mild/moderate right, severe left). C3-C4: Grade 1 anterolisthesis. Posterior disc osteophyte complex. Uncovertebral, facet and ligamentum flavum hypertrophy. Mild/moderate spinal canal stenosis without spinal cord mass effect. Bilateral neural foraminal narrowing (moderate/severe right, severe left). C4-C5: 4 mm grade 1 anterolisthesis. Disc uncovering with disc bulge and endplate spurring. Advanced facet arthrosis and ligamentum flavum hypertrophy. Severe spinal canal stenosis with marked flattening of the spinal cord (series 8, image 11). Subtle T2 hyperintense signal abnormality within the spinal cord at this level compatible with myelomalacia and/or focal edema. Severe bilateral neural foraminal narrowing. C5-C6: Posterior disc osteophyte complex. Uncovertebral and facet hypertrophy. No significant spinal canal stenosis. Bilateral neural foraminal narrowing (mild/moderate right, mild left). C6-C7: Posterior disc osteophyte complex. Uncovertebral and facet hypertrophy. Mild/moderate spinal canal stenosis. Severe bilateral neural foraminal narrowing. C7-T1: Posterior disc osteophyte complex. Bilateral disc osteophyte ridge/uncinate spurring. Facet and ligamentum flavum hypertrophy. Moderate spinal canal stenosis with contact upon the dorsal and ventral spinal cord and mild spinal cord flattening. Severe bilateral neural foraminal narrowing. These results were called by telephone at the time of interpretation on 12/20/2019 at 2:30 pm to provider [facility] , who verbally acknowledged  these results. IMPRESSION: Advanced cervical spondylosis with findings most notably as follows. Redemonstrated basilar invagination with the tip of the dens extending above McRae line by 9 mm. Advanced degenerative changes at the C1-C2 articulation with associated ligamentous hypertrophy/pannus formation. C1-C2 anterior subluxation with the anterior atlantodental interval measuring 9 mm. Resultant multifactorial severe spinal canal stenosis at the C2 vertebral body level with significant spinal cord flattening. Subtle myelomalacia and/or edema questioned within the spinal cord at this level. At C2-C3, there is multifactorial severe spinal canal stenosis with significant spinal cord flattening. Subtle myelomalacia and/or edema questioned within the spinal cord at this level. Severe left neural foraminal narrowing At C4-C5, multifactorial severe spinal canal stenosis with marked spinal cord flattening. Spinal cord signal abnormality at this level compatible with myelomalacia and/or edema. Severe bilateral neural foraminal narrowing. Additional sites of mild and moderate spinal canal stenosis as described. Additional sites of neural foraminal narrowing as described and greatest bilaterally  at C3-C4 (moderate/severe right, severe left) and bilaterally at C6-C7 (severe). Electronically Signed: By: Jackey Loge DO On: 12/20/2019 14:31   DG Knee Complete 4 Views Right  Result Date: 12/20/2019 CLINICAL DATA:  Larey Seat this morning, abrasions at anteromedial RIGHT knee EXAM: RIGHT KNEE - COMPLETE 4+ VIEW COMPARISON:  None FINDINGS: Osseous demineralization. Advanced tricompartmental osteoarthritic changes with joint space narrowing and spur formation. Medial soft tissue swelling. No acute fracture, dislocation, or bone destruction. No joint effusion. IMPRESSION: Advanced tricompartmental osteoarthritic changes and diffuse osseous demineralization. No acute bony abnormalities. Electronically Signed   By: Ulyses Southward M.D.    On: 12/20/2019 10:51    Procedures Procedures (including critical care time)  Medications Ordered in ED Medications  LORazepam (ATIVAN) injection 1 mg (1 mg Intravenous Given 12/20/19 1321)    ED Course  I have reviewed the triage vital signs and the nursing notes.  Pertinent labs & imaging results that were available during my care of the patient were reviewed by me and considered in my medical decision making (see chart for details).    MDM Rules/Calculators/A&P                          69 year old male presents via EMS from skilled nursing facility, he is accompanied by his brother who was his primary caretaker prior to patient going into skilled nursing facility 4 days ago.  Patient has had 2 unwitnessed falls, and denies focal pain, does have some small abrasions to bilateral knees, which have been cleaned and dressed here in the emergency department.  Abrasion largest over the right knee, no larger lacerations that would require suture repair.  There is also a small abrasion to the left forehead that suggest patient may have hit his head, will get x-rays of the right knee and CT head and C-spine.  CT of the head is unremarkable.  X-rays of the knee show significant osteoarthritis but no acute fracture or abnormality.  CT of the cervical spine with significant degenerative changes, spinal stenosis with possible cord compression at C2-C3, there is also basilar invagination through the foramen magnum.  Patient does not seem to have a new acute neurologic deficit although exam is limited due to language barrier and patient's baseline.  Will discuss with neurosurgery.  Case discussed with Dr. Johnsie Cancel who reviewed CT, recommends getting MRI of the cervical spine without contrast, feels that unless there is significant differences on MRI patient will likely be appropriate for outpatient follow-up with him in the office.  I discussed MRI results with Dr. Johnsie Cancel with neurosurgery, he  reviewed studies and agrees plan is still for outpatient follow-up with him in the office, he feels that these are likely chronic and no emergent intervention is indicated today.  I discussed this plan with patient's brother who is in agreement.  Will discharge patient back to facility and will have brother call to schedule follow-up appointment with the neurosurgery clinic.  Patient is very sleepy but did just receive Ativan prior to MRI, vitals are stable, patient responds to  Final Clinical Impression(s) / ED Diagnoses Final diagnoses:  Fall, initial encounter  Cervical spinal stenosis    Rx / DC Orders ED Discharge Orders    None       Legrand Rams 12/21/19 0050    Pollyann Savoy, MD 12/21/19 [phone]

## 2019-12-27 DIAGNOSIS — R32 Unspecified urinary incontinence: Secondary | ICD-10-CM | POA: Diagnosis not present

## 2019-12-27 DIAGNOSIS — F32A Depression, unspecified: Secondary | ICD-10-CM | POA: Diagnosis not present

## 2019-12-27 DIAGNOSIS — F419 Anxiety disorder, unspecified: Secondary | ICD-10-CM | POA: Diagnosis not present

## 2019-12-27 DIAGNOSIS — M419 Scoliosis, unspecified: Secondary | ICD-10-CM | POA: Diagnosis not present

## 2019-12-27 DIAGNOSIS — F79 Unspecified intellectual disabilities: Secondary | ICD-10-CM | POA: Diagnosis not present

## 2019-12-27 DIAGNOSIS — Z9181 History of falling: Secondary | ICD-10-CM | POA: Diagnosis not present

## 2019-12-27 DIAGNOSIS — H409 Unspecified glaucoma: Secondary | ICD-10-CM | POA: Diagnosis not present

## 2019-12-27 DIAGNOSIS — I10 Essential (primary) hypertension: Secondary | ICD-10-CM | POA: Diagnosis not present

## 2019-12-27 DIAGNOSIS — S81011D Laceration without foreign body, right knee, subsequent encounter: Secondary | ICD-10-CM | POA: Diagnosis not present

## 2019-12-27 DIAGNOSIS — N401 Enlarged prostate with lower urinary tract symptoms: Secondary | ICD-10-CM | POA: Diagnosis not present

## 2019-12-27 DIAGNOSIS — L989 Disorder of the skin and subcutaneous tissue, unspecified: Secondary | ICD-10-CM | POA: Diagnosis not present

## 2019-12-27 DIAGNOSIS — G8929 Other chronic pain: Secondary | ICD-10-CM | POA: Diagnosis not present

## 2019-12-29 DIAGNOSIS — R2681 Unsteadiness on feet: Secondary | ICD-10-CM | POA: Diagnosis not present

## 2019-12-29 DIAGNOSIS — R269 Unspecified abnormalities of gait and mobility: Secondary | ICD-10-CM | POA: Diagnosis not present

## 2019-12-29 DIAGNOSIS — R531 Weakness: Secondary | ICD-10-CM | POA: Diagnosis not present

## 2019-12-29 DIAGNOSIS — F79 Unspecified intellectual disabilities: Secondary | ICD-10-CM | POA: Diagnosis not present

## 2019-12-31 DIAGNOSIS — S81011D Laceration without foreign body, right knee, subsequent encounter: Secondary | ICD-10-CM | POA: Diagnosis not present

## 2019-12-31 DIAGNOSIS — Q758 Other specified congenital malformations of skull and face bones: Secondary | ICD-10-CM | POA: Diagnosis not present

## 2019-12-31 DIAGNOSIS — I10 Essential (primary) hypertension: Secondary | ICD-10-CM | POA: Diagnosis not present

## 2019-12-31 DIAGNOSIS — F32A Depression, unspecified: Secondary | ICD-10-CM | POA: Diagnosis not present

## 2019-12-31 DIAGNOSIS — F79 Unspecified intellectual disabilities: Secondary | ICD-10-CM | POA: Diagnosis not present

## 2019-12-31 DIAGNOSIS — F419 Anxiety disorder, unspecified: Secondary | ICD-10-CM | POA: Diagnosis not present

## 2019-12-31 DIAGNOSIS — M4712 Other spondylosis with myelopathy, cervical region: Secondary | ICD-10-CM | POA: Diagnosis not present

## 2019-12-31 DIAGNOSIS — G8929 Other chronic pain: Secondary | ICD-10-CM | POA: Diagnosis not present

## 2020-01-04 DIAGNOSIS — F79 Unspecified intellectual disabilities: Secondary | ICD-10-CM | POA: Diagnosis not present

## 2020-01-04 DIAGNOSIS — I10 Essential (primary) hypertension: Secondary | ICD-10-CM | POA: Diagnosis not present

## 2020-01-04 DIAGNOSIS — S81011D Laceration without foreign body, right knee, subsequent encounter: Secondary | ICD-10-CM | POA: Diagnosis not present

## 2020-01-04 DIAGNOSIS — F419 Anxiety disorder, unspecified: Secondary | ICD-10-CM | POA: Diagnosis not present

## 2020-01-04 DIAGNOSIS — G8929 Other chronic pain: Secondary | ICD-10-CM | POA: Diagnosis not present

## 2020-01-04 DIAGNOSIS — F32A Depression, unspecified: Secondary | ICD-10-CM | POA: Diagnosis not present

## 2020-01-04 DIAGNOSIS — N3090 Cystitis, unspecified without hematuria: Secondary | ICD-10-CM | POA: Diagnosis not present

## 2020-01-04 DIAGNOSIS — N390 Urinary tract infection, site not specified: Secondary | ICD-10-CM | POA: Diagnosis not present

## 2020-01-05 DIAGNOSIS — F79 Unspecified intellectual disabilities: Secondary | ICD-10-CM | POA: Diagnosis not present

## 2020-01-05 DIAGNOSIS — R269 Unspecified abnormalities of gait and mobility: Secondary | ICD-10-CM | POA: Diagnosis not present

## 2020-01-05 DIAGNOSIS — R531 Weakness: Secondary | ICD-10-CM | POA: Diagnosis not present

## 2020-01-05 DIAGNOSIS — R2681 Unsteadiness on feet: Secondary | ICD-10-CM | POA: Diagnosis not present

## 2020-01-08 DIAGNOSIS — S81011D Laceration without foreign body, right knee, subsequent encounter: Secondary | ICD-10-CM | POA: Diagnosis not present

## 2020-01-08 DIAGNOSIS — F419 Anxiety disorder, unspecified: Secondary | ICD-10-CM | POA: Diagnosis not present

## 2020-01-08 DIAGNOSIS — G8929 Other chronic pain: Secondary | ICD-10-CM | POA: Diagnosis not present

## 2020-01-08 DIAGNOSIS — I10 Essential (primary) hypertension: Secondary | ICD-10-CM | POA: Diagnosis not present

## 2020-01-08 DIAGNOSIS — F32A Depression, unspecified: Secondary | ICD-10-CM | POA: Diagnosis not present

## 2020-01-08 DIAGNOSIS — F79 Unspecified intellectual disabilities: Secondary | ICD-10-CM | POA: Diagnosis not present

## 2020-01-12 DIAGNOSIS — F79 Unspecified intellectual disabilities: Secondary | ICD-10-CM | POA: Diagnosis not present

## 2020-01-12 DIAGNOSIS — R2681 Unsteadiness on feet: Secondary | ICD-10-CM | POA: Diagnosis not present

## 2020-01-12 DIAGNOSIS — R531 Weakness: Secondary | ICD-10-CM | POA: Diagnosis not present

## 2020-01-12 DIAGNOSIS — R269 Unspecified abnormalities of gait and mobility: Secondary | ICD-10-CM | POA: Diagnosis not present

## 2020-01-13 ENCOUNTER — Encounter (HOSPITAL_COMMUNITY): Payer: Self-pay

## 2020-01-13 ENCOUNTER — Other Ambulatory Visit: Payer: Self-pay

## 2020-01-13 ENCOUNTER — Emergency Department (HOSPITAL_COMMUNITY)
Admission: EM | Admit: 2020-01-13 | Discharge: 2020-01-14 | Disposition: A | Discharge status: Home / Self Care | Payer: Medicare Other | Attending: Emergency Medicine | Admitting: Emergency Medicine

## 2020-01-13 DIAGNOSIS — L089 Local infection of the skin and subcutaneous tissue, unspecified: Secondary | ICD-10-CM

## 2020-01-13 DIAGNOSIS — W19XXXA Unspecified fall, initial encounter: Secondary | ICD-10-CM | POA: Diagnosis not present

## 2020-01-13 DIAGNOSIS — L03115 Cellulitis of right lower limb: Secondary | ICD-10-CM | POA: Diagnosis not present

## 2020-01-13 DIAGNOSIS — R404 Transient alteration of awareness: Secondary | ICD-10-CM | POA: Diagnosis not present

## 2020-01-13 MED ORDER — CEPHALEXIN 500 MG PO CAPS
500.0000 mg | ORAL_CAPSULE | Freq: Once | ORAL | Status: AC
  Administered 2020-01-13: 500 mg via ORAL
  Filled 2020-01-13: qty 1

## 2020-01-13 MED ORDER — CEPHALEXIN 500 MG PO CAPS: 500.0000 mg | ORAL_CAPSULE | Freq: Two times a day (BID) | ORAL | 0 refills | Status: DC

## 2020-01-13 NOTE — ED Triage Notes (Signed)
Pt BIB EMS. Pt lives at [facility] Pt fell on Monday night and had an abrasion to the right knee. Pt family member feels the facility did not take care of wound and now the wound is infected. Per family pt is at his baseline.    BP-130/80 HR-90 Temp-98.0 O2-98% CBG-110

## 2020-01-13 NOTE — ED Provider Notes (Signed)
[facility]-EMERGENCY DEPT Provider Note   CSN: [id number] Arrival date & time: 01/13/20  1902     History Chief Complaint  Patient presents with  . Wound Infection    Jeffery Fox is a 69 y.o. male.  Patient is 69 year old male who lives at assisted living facility presenting today with his son due to wound on his right knee.  Son reports that patient has had several falls recently and was having leg pain.  However that has seemed to be getting better but yesterday he was at the facility visiting his father and noticed that he had a wound on his right knee.  He requested that the facility take a look at it and had to be persistent when someone finally looked at it today and felt that it was potentially infected and that they would call the doctor in the morning.  Son asked why they could not call the on-call doctor tonight for orders and they decided to transfer him to the hospital.  Son reports that patient is at his baseline.  He has been acting normally and has not had any fever or decreased oral intake.  He has not complained of pain and is still getting around the way he normally would.  Patient was receiving wound care in the past from [facility] but he received a letter via email stating that they could no longer see him because he was leaving the facility for physical therapy and Medicare would not cover for them to see him as well.  The history is provided by a relative.       History reviewed. No pertinent past medical history.  There are no problems to display for this patient.        History reviewed. No pertinent family history.  Social History   Tobacco Use  . Smoking status: Not on file  Substance Use Topics  . Alcohol use: Not on file  . Drug use: Not on file    Home Medications Prior to Admission medications   Medication Sig Start Date End Date Taking? Authorizing Provider  acetaminophen (TYLENOL) 500 MG tablet Take 1,000  mg by mouth in the morning and at bedtime.    [provider]  CARTIA XT 120 MG 24 hr capsule Take 120 mg by mouth daily. 09/24/19   [provider]  cetirizine (ZYRTEC) 10 MG tablet Take 10 mg by mouth daily.    [provider]  diclofenac Sodium (VOLTAREN) 1 % GEL Apply 2 g topically daily as needed (lower back pain).    [provider]  dorzolamide-timolol (COSOPT) 22.3-6.8 MG/ML ophthalmic solution Place 1 drop into the right eye daily.    [provider]  escitalopram (LEXAPRO) 10 MG tablet Take 10 mg by mouth daily.    [provider]  famotidine (PEPCID) 20 MG tablet Take 20 mg by mouth daily.    [provider]  HYDROcodone-acetaminophen (NORCO/VICODIN) 5-325 MG tablet Take 1 tablet by mouth daily as needed for moderate pain.    [provider]  hydrOXYzine (ATARAX/VISTARIL) 25 MG tablet Take 25 mg by mouth every 8 (eight) hours as needed for itching.    [provider]  meloxicam (MOBIC) 15 MG tablet Take 15 mg by mouth daily.    [provider]  methocarbamol (ROBAXIN) 500 MG tablet Take 500 mg by mouth every 8 (eight) hours as needed for muscle spasms.    [provider]  Multiple Vitamin (MULTIVITAMIN WITH  MINERALS) TABS tablet Take 1 tablet by mouth daily.    [provider]  oxybutynin (DITROPAN-XL) 10 MG 24 hr tablet Take 10 mg by mouth at bedtime.    [provider]  polyethylene glycol (MIRALAX / GLYCOLAX) 17 g packet Take 17 g by mouth daily.    [provider]  Probiotic Product (PROBIOTIC-10 ULTIMATE) CAPS Take 1 capsule by mouth daily.    [provider]  risperiDONE (RISPERDAL) 2 MG tablet Take 2 mg by mouth at bedtime.    [provider]  tamsulosin (FLOMAX) 0.4 MG CAPS capsule Take 0.4 mg by mouth daily.    [provider]  Wheat Dextrin (BENEFIBER PO) Take 1 Scoop by mouth daily.    [provider]    Allergies      Sulfa antibiotics  Review of Systems   Review of Systems  All other systems reviewed and are negative.   Physical Exam Updated Vital Signs BP (!) 146/68 (BP Location: Left Arm)   Pulse 85   Temp 98.5 F (36.9 C) (Oral)   Resp 18   Ht 5\' 2"  (1.575 m) Comment: per brother   Wt 52.2 kg Comment: per brother   SpO2 96%   BMI 21.03 kg/m   Physical Exam Vitals and nursing note reviewed.  Constitutional:      General: He is not in acute distress.    Appearance: He is well-developed and normal weight.     Comments: Sleeping but easily arousable  HENT:     Head: Normocephalic.     Comments: Mild bruise to the chin Cardiovascular:     Rate and Rhythm: Normal rate and regular rhythm.     Pulses: Normal pulses.     Heart sounds: No murmur heard.   Pulmonary:     Effort: Pulmonary effort is normal. No respiratory distress.     Breath sounds: Normal breath sounds. No wheezing or rales.  Abdominal:     General: There is no distension.     Palpations: Abdomen is soft.     Tenderness: There is no abdominal tenderness. There is no guarding or rebound.  Musculoskeletal:        General: No tenderness. Normal range of motion.     Cervical back: Normal range of motion and neck supple.       Legs:     Comments: Healing abrasion to the left elbow.  Healing abrasion to the left knee.  Skin:    General: Skin is warm and dry.     Capillary Refill: Capillary refill takes 2 to 3 seconds.     Findings: No erythema or rash.  Neurological:     Mental Status: Mental status is at baseline.  Psychiatric:     Comments: Cooperative     ED Results / Procedures / Treatments   Labs (all labs ordered are listed, but only abnormal results are displayed) Labs Reviewed - No data to display  EKG None  Radiology No results found.  Procedures Procedures (including critical care time)  Medications Ordered in ED Medications - No data to display  ED Course  I have reviewed the triage vital  signs and the nursing notes.  Pertinent labs & imaging results that were available during my care of the patient were reviewed by me and considered in my medical decision making (see chart for details).    MDM Rules/Calculators/A&P  Patient with a wound to the right knee which is unclear how long it has been there because the son just found out about it yesterday.  Presumed from a fall a few days ago.  Patient has some mild erythema around the wound but no deeper tunneling wounds.  No signs to suggest septic joint.  Son reports patient has been at his baseline and has not been acting any different.  Possible early infection as there is mild erythema to the skin but patient mostly needs wound care.  Son reports that they are having difficulty obtaining wound care at the assisted living facility where he lives.  Will discuss with transitional care team.  Will apply wound care here and start patient on a 5-day course of Keflex.  Final Clinical Impression(s) / ED Diagnoses Final diagnoses:  Wound infection    Rx / DC Orders ED Discharge Orders         Ordered    cephALEXin (KEFLEX) 500 MG capsule  2 times daily        01/13/20 2113           Gwyneth Sprout, MD 01/13/20 2114

## 2020-01-13 NOTE — Progress Notes (Signed)
   01/13/20 2222  TOC ED Mini Assessment  TOC Time spent with patient (minutes): 20  PING Used in TOC Assessment No  Admission or Readmission Diverted Yes  Interventions which prevented an admission or readmission Follow-up medical appointment (referral to the wound clinic)  Barrier interventions wound check  ED CM sent referral to [facility] wound at [facility], patient and family will have to call tomorrow to schedule appointment.

## 2020-01-13 NOTE — Discharge Instructions (Signed)
Start taking the antibiotic for possible early infection.  Wound needs to have a Vaseline gauze applied and dressing needs to be changed once a day until able to see wound care.  Call wound care in the morning to schedule an appointment.

## 2020-01-14 ENCOUNTER — Telehealth: Payer: Self-pay | Admitting: Surgery

## 2020-01-14 DIAGNOSIS — R404 Transient alteration of awareness: Secondary | ICD-10-CM | POA: Diagnosis not present

## 2020-01-14 DIAGNOSIS — R2681 Unsteadiness on feet: Secondary | ICD-10-CM | POA: Diagnosis not present

## 2020-01-14 DIAGNOSIS — F79 Unspecified intellectual disabilities: Secondary | ICD-10-CM | POA: Diagnosis not present

## 2020-01-14 DIAGNOSIS — Z7401 Bed confinement status: Secondary | ICD-10-CM | POA: Diagnosis not present

## 2020-01-14 DIAGNOSIS — R531 Weakness: Secondary | ICD-10-CM | POA: Diagnosis not present

## 2020-01-14 DIAGNOSIS — M2550 Pain in unspecified joint: Secondary | ICD-10-CM | POA: Diagnosis not present

## 2020-01-14 DIAGNOSIS — R0902 Hypoxemia: Secondary | ICD-10-CM | POA: Diagnosis not present

## 2020-01-14 DIAGNOSIS — R269 Unspecified abnormalities of gait and mobility: Secondary | ICD-10-CM | POA: Diagnosis not present

## 2020-01-14 NOTE — Telephone Encounter (Signed)
Appt scheduled with the [facility] 02/23/20. CM also reached out to [facility]

## 2020-01-19 DIAGNOSIS — R531 Weakness: Secondary | ICD-10-CM | POA: Diagnosis not present

## 2020-01-19 DIAGNOSIS — F79 Unspecified intellectual disabilities: Secondary | ICD-10-CM | POA: Diagnosis not present

## 2020-01-19 DIAGNOSIS — R269 Unspecified abnormalities of gait and mobility: Secondary | ICD-10-CM | POA: Diagnosis not present

## 2020-01-19 DIAGNOSIS — R2681 Unsteadiness on feet: Secondary | ICD-10-CM | POA: Diagnosis not present

## 2020-01-26 DIAGNOSIS — L989 Disorder of the skin and subcutaneous tissue, unspecified: Secondary | ICD-10-CM | POA: Diagnosis not present

## 2020-01-28 DIAGNOSIS — F79 Unspecified intellectual disabilities: Secondary | ICD-10-CM | POA: Diagnosis not present

## 2020-01-28 DIAGNOSIS — R2681 Unsteadiness on feet: Secondary | ICD-10-CM | POA: Diagnosis not present

## 2020-01-28 DIAGNOSIS — R531 Weakness: Secondary | ICD-10-CM | POA: Diagnosis not present

## 2020-01-28 DIAGNOSIS — R269 Unspecified abnormalities of gait and mobility: Secondary | ICD-10-CM | POA: Diagnosis not present

## 2020-02-02 DIAGNOSIS — R531 Weakness: Secondary | ICD-10-CM | POA: Diagnosis not present

## 2020-02-02 DIAGNOSIS — R269 Unspecified abnormalities of gait and mobility: Secondary | ICD-10-CM | POA: Diagnosis not present

## 2020-02-02 DIAGNOSIS — R2681 Unsteadiness on feet: Secondary | ICD-10-CM | POA: Diagnosis not present

## 2020-02-02 DIAGNOSIS — F79 Unspecified intellectual disabilities: Secondary | ICD-10-CM | POA: Diagnosis not present

## 2020-02-02 DIAGNOSIS — I10 Essential (primary) hypertension: Secondary | ICD-10-CM | POA: Diagnosis not present

## 2020-02-02 DIAGNOSIS — Z125 Encounter for screening for malignant neoplasm of prostate: Secondary | ICD-10-CM | POA: Diagnosis not present

## 2020-02-04 DIAGNOSIS — R2681 Unsteadiness on feet: Secondary | ICD-10-CM | POA: Diagnosis not present

## 2020-02-04 DIAGNOSIS — R269 Unspecified abnormalities of gait and mobility: Secondary | ICD-10-CM | POA: Diagnosis not present

## 2020-02-04 DIAGNOSIS — F79 Unspecified intellectual disabilities: Secondary | ICD-10-CM | POA: Diagnosis not present

## 2020-02-04 DIAGNOSIS — R531 Weakness: Secondary | ICD-10-CM | POA: Diagnosis not present

## 2020-02-09 DIAGNOSIS — M4125 Other idiopathic scoliosis, thoracolumbar region: Secondary | ICD-10-CM | POA: Diagnosis not present

## 2020-02-09 DIAGNOSIS — R531 Weakness: Secondary | ICD-10-CM | POA: Diagnosis not present

## 2020-02-09 DIAGNOSIS — H547 Unspecified visual loss: Secondary | ICD-10-CM | POA: Diagnosis not present

## 2020-02-09 DIAGNOSIS — Z0000 Encounter for general adult medical examination without abnormal findings: Secondary | ICD-10-CM | POA: Diagnosis not present

## 2020-02-09 DIAGNOSIS — F79 Unspecified intellectual disabilities: Secondary | ICD-10-CM | POA: Diagnosis not present

## 2020-02-09 DIAGNOSIS — I10 Essential (primary) hypertension: Secondary | ICD-10-CM | POA: Diagnosis not present

## 2020-02-09 DIAGNOSIS — R82998 Other abnormal findings in urine: Secondary | ICD-10-CM | POA: Diagnosis not present

## 2020-02-09 DIAGNOSIS — M6281 Muscle weakness (generalized): Secondary | ICD-10-CM | POA: Diagnosis not present

## 2020-02-09 DIAGNOSIS — R972 Elevated prostate specific antigen [PSA]: Secondary | ICD-10-CM | POA: Diagnosis not present

## 2020-02-09 DIAGNOSIS — G8929 Other chronic pain: Secondary | ICD-10-CM | POA: Diagnosis not present

## 2020-02-09 DIAGNOSIS — Z7189 Other specified counseling: Secondary | ICD-10-CM | POA: Diagnosis not present

## 2020-02-09 DIAGNOSIS — N400 Enlarged prostate without lower urinary tract symptoms: Secondary | ICD-10-CM | POA: Diagnosis not present

## 2020-02-09 DIAGNOSIS — Z1331 Encounter for screening for depression: Secondary | ICD-10-CM | POA: Diagnosis not present

## 2020-02-09 DIAGNOSIS — R269 Unspecified abnormalities of gait and mobility: Secondary | ICD-10-CM | POA: Diagnosis not present

## 2020-02-09 DIAGNOSIS — Z1339 Encounter for screening examination for other mental health and behavioral disorders: Secondary | ICD-10-CM | POA: Diagnosis not present

## 2020-02-09 DIAGNOSIS — R2681 Unsteadiness on feet: Secondary | ICD-10-CM | POA: Diagnosis not present

## 2020-02-09 DIAGNOSIS — F418 Other specified anxiety disorders: Secondary | ICD-10-CM | POA: Diagnosis not present

## 2020-02-11 DIAGNOSIS — R531 Weakness: Secondary | ICD-10-CM | POA: Diagnosis not present

## 2020-02-11 DIAGNOSIS — F79 Unspecified intellectual disabilities: Secondary | ICD-10-CM | POA: Diagnosis not present

## 2020-02-11 DIAGNOSIS — R269 Unspecified abnormalities of gait and mobility: Secondary | ICD-10-CM | POA: Diagnosis not present

## 2020-02-11 DIAGNOSIS — R2681 Unsteadiness on feet: Secondary | ICD-10-CM | POA: Diagnosis not present

## 2020-02-18 DIAGNOSIS — F79 Unspecified intellectual disabilities: Secondary | ICD-10-CM | POA: Diagnosis not present

## 2020-02-18 DIAGNOSIS — R269 Unspecified abnormalities of gait and mobility: Secondary | ICD-10-CM | POA: Diagnosis not present

## 2020-02-18 DIAGNOSIS — R531 Weakness: Secondary | ICD-10-CM | POA: Diagnosis not present

## 2020-02-18 DIAGNOSIS — R2681 Unsteadiness on feet: Secondary | ICD-10-CM | POA: Diagnosis not present

## 2020-02-23 ENCOUNTER — Other Ambulatory Visit: Payer: Self-pay

## 2020-02-23 ENCOUNTER — Encounter (HOSPITAL_BASED_OUTPATIENT_CLINIC_OR_DEPARTMENT_OTHER): Payer: Medicare Other | Attending: Internal Medicine | Admitting: Internal Medicine

## 2020-02-23 DIAGNOSIS — H9190 Unspecified hearing loss, unspecified ear: Secondary | ICD-10-CM | POA: Insufficient documentation

## 2020-02-23 DIAGNOSIS — H547 Unspecified visual loss: Secondary | ICD-10-CM | POA: Insufficient documentation

## 2020-02-23 DIAGNOSIS — F79 Unspecified intellectual disabilities: Secondary | ICD-10-CM | POA: Diagnosis not present

## 2020-02-23 DIAGNOSIS — L98498 Non-pressure chronic ulcer of skin of other sites with other specified severity: Secondary | ICD-10-CM | POA: Diagnosis not present

## 2020-02-23 DIAGNOSIS — Z8673 Personal history of transient ischemic attack (TIA), and cerebral infarction without residual deficits: Secondary | ICD-10-CM | POA: Insufficient documentation

## 2020-02-23 DIAGNOSIS — R531 Weakness: Secondary | ICD-10-CM | POA: Diagnosis not present

## 2020-02-23 DIAGNOSIS — R269 Unspecified abnormalities of gait and mobility: Secondary | ICD-10-CM | POA: Diagnosis not present

## 2020-02-23 DIAGNOSIS — Z993 Dependence on wheelchair: Secondary | ICD-10-CM | POA: Diagnosis not present

## 2020-02-23 DIAGNOSIS — I10 Essential (primary) hypertension: Secondary | ICD-10-CM | POA: Insufficient documentation

## 2020-02-23 DIAGNOSIS — R2681 Unsteadiness on feet: Secondary | ICD-10-CM | POA: Diagnosis not present

## 2020-02-23 NOTE — Progress Notes (Addendum)
Jeffery Fox, Jeffery Fox ([id number]) Visit Report for 02/23/2020 Chief Complaint Document Details Patient Name: Date of Service: Jeffery DallasHA NSO Fox, [name] 02/23/2020 2:45 PM Medical Record Number: [id number] Patient Account Number: [id number] Date of Birth/Sex: Treating RN: 12/06/1950 (70 y.o. Lucious GrovesM) Breedlove, Lauren Primary Care Provider: PA, Candis MusaGUILFO RD Other Clinician: Referring Provider: Treating Provider/Extender: Baltazar Najjarobson, Surina Storts PA, GUILFO RD Weeks in Treatment: 0 Information Obtained from: Patient Chief Complaint 02/23/2020; patient is here for review of a wound on the right medial knee Electronic Signature(s) Signed: 02/23/2020 5:07:05 PM By: Baltazar Najjarobson, Keyuna Cuthrell MD Entered By: Baltazar Najjarobson, Tylee Yum on 02/23/2020 15:53:47 -------------------------------------------------------------------------------- Debridement Details Patient Name: Date of Service: Jeffery Fox, [name] 02/23/2020 2:45 PM Medical Record Number: [id number] Patient Account Number: [id number] Date of Birth/Sex: Treating RN: 01/31/1951 (70 y.o. Damaris SchoonerM) Boehlein, Linda Primary Care Provider: PA, Candis MusaGUILFO RD Other Clinician: Referring Provider: Treating Provider/Extender: Baltazar Najjarobson, Isabellamarie Randa PA, GUILFO RD Weeks in Treatment: 0 Debridement Performed for Assessment: Wound #1 Right Knee Performed By: Clinician Fonnie MuBreedlove, Lauren, RN Debridement Type: Chemical/Enzymatic/Mechanical Agent Used: anasept and gauze Level of Consciousness (Pre-procedure): Awake and Alert Pre-procedure Verification/Time Out No Taken: Bleeding: Minimum Hemostasis Achieved: Pressure Response to Treatment: Procedure was tolerated well Level of Consciousness (Post- Awake and Alert procedure): Post Debridement Measurements of Total Wound Length: (cm) 1.7 Width: (cm) 1.7 Depth: (cm) 0.1 Volume: (cm) 0.227 Character of Wound/Ulcer Post Debridement: Improved Post Procedure Diagnosis Same as Pre-procedure Electronic Signature(s) Signed: 02/26/2020 2:33:56 PM By: Baltazar Najjarobson, Esmay Amspacher MD Signed:  03/08/2020 5:43:05 PM By: Zenaida DeedBoehlein, Linda RN, BSN Entered By: Zenaida DeedBoehlein, Linda on 02/26/2020 13:18:23 -------------------------------------------------------------------------------- HPI Details Patient Name: Date of Service: HA NSO Dorris Fox, [name] 02/23/2020 2:45 PM Medical Record Number: [id number] Patient Account Number: [id number] Date of Birth/Sex: Treating RN: 10/04/1950 (70 y.o. Lucious GrovesM) Breedlove, Lauren Primary Care Provider: PA, Candis MusaGUILFO RD Other Clinician: Referring Provider: Treating Provider/Extender: Baltazar Najjarobson, Aishani Kalis PA, GUILFO RD Weeks in Treatment: 0 History of Present Illness HPI Description: ADMISSION 02/23/2020 This is a 70 year old man who [facility]. He is blind and hard of hearing. Very disabled I think because of myelopathy. In fact he is wheelchair-bound and spends only time in wheelchair or in bed. Apparently his problems started at the end of November he fell out of bed and developed a wound on the right medial knee. This healed fairly quickly however he had another fall and the area again was reopened caused by friction on the carpet. Again this wound was dressed at the facility and again healed up only to open up again within the last week and a half. In fact he saw his primary doctor last week and there was no open wound in place. His situation is complicated by the fact that he is going to outpatient rehab therefore he cannot have home health in the facility based on the lack of insurance coverage. The patient's brother who accompanied him said that [facility] however not totally sure whether they will change the dressing or not. I believe they are using Xeroform and foam but I am not completely sure who is changing it. The brother says he has not had a dressing on in a week Past medical history includes spondylosis with myelopathy apparently at the thoracic and lumbar area, glaucoma cyst causing blindness,  major depression, hard of hearing, hypertension, BPH, history of a CVA. [facility] Prior to this he lives with a brother who accompanied him today. Electronic Signature(s) Signed:  02/23/2020 5:07:05 PM By: Baltazar Najjar MD Entered By: Baltazar Najjar on 02/23/2020 15:57:15 -------------------------------------------------------------------------------- Physical Exam Details Patient Name: Date of Service: Jeffery Fox NNEY 02/23/2020 2:45 PM Medical Record Number: [id number] Patient Account Number: [id number] Date of Birth/Sex: Treating RN: Sep 28, 1950 (70 y.o. Lucious Groves Primary Care Provider: PA, Candis Musa RD Other Clinician: Referring Provider: Treating Provider/Extender: Baltazar Najjar PA, GUILFO RD Weeks in Treatment: 0 Constitutional Sitting or standing Blood Pressure is within target range for patient.. Pulse regular and within target range for patient.Marland Kitchen Respirations regular, non-labored and within target range.. Temperature is normal and within the target range for the patient.Marland Kitchen Appears in no distress. Eyes . Psychiatric Patient did not verbalize with me today. Notes Wound exam; the patient has a circular healthy looking wound on the right medial knee. There is this is epithelializing. Under illumination the surface of the wound looks healthy. No debridement was felt to be necessary. Electronic Signature(s) Signed: 02/23/2020 5:07:05 PM By: Baltazar Najjar MD Entered By: Baltazar Najjar on 02/23/2020 15:59:49 -------------------------------------------------------------------------------- Physician Orders Details Patient Name: Date of Service: HA NSO Fox, [name] 02/23/2020 2:45 PM Medical Record Number: [id number] Patient Account Number: [id number] Date of Birth/Sex: Treating RN: [birth year] (70 y.o. Lucious Groves Primary Care Provider: PA, Candis Musa RD Other Clinician: Referring Provider: Treating Provider/Extender: Baltazar Najjar PA, GUILFO  RD Weeks in Treatment: 0 Verbal / Phone Orders: No Diagnosis Coding Follow-up Appointments Return Appointment in 2 weeks. Bathing/ Shower/ Hygiene May shower and wash wound with soap and water. Off-Loading Other: - Keep pressure off of right knee. Additional Orders / Instructions Other: - If wound dressing becomes soiled or falls off, please re-dress with proper wound care and wound dressing. Wound Treatment Wound #1 - Knee Wound Laterality: Right Cleanser: Normal Saline (DME) (Generic) Every Other Day/30 Days Discharge Instructions: Cleanse the wound with Normal Saline prior to applying a clean dressing using gauze sponges, not tissue or cotton balls. Peri-Wound Care: Skin Prep Every Other Day/30 Days Discharge Instructions: Use skin prep as directed Prim Dressing: Fibracol Plus Dressing, 4x4.38 in (collagen) (DME) (Generic) Every Other Day/30 Days ary Discharge Instructions: Moisten collagen with saline or hydrogel. May also use KY jelly. Secondary Dressing: ComfortFoam Border, 3x3 in (silicone border) (DME) (Generic) Every Other Day/30 Days Discharge Instructions: Apply over primary dressing as directed. Secured With: [employer], 4.5x3.1 (in/yd) (DME) (Generic) Every Other Day/30 Days Discharge Instructions: Secure with Kerlix as directed. Secured With: 86M Medipore H Soft Cloth Surgical Tape, 2x2 (in/yd) (DME) (Generic) Every Other Day/30 Days Discharge Instructions: Secure dressing with tape as directed. Patient Medications llergies: Sulfa (Sulfonamide Antibiotics) A Notifications Medication Indication Start End lidocaine DOSE topical 5 % ointment - ointment topical Electronic Signature(s) Signed: 02/23/2020 5:07:05 PM By: Baltazar Najjar MD Signed: 02/23/2020 5:35:07 PM By: Fonnie Mu RN Entered By: Fonnie Mu on 02/23/2020 15:33:05 -------------------------------------------------------------------------------- Problem List Details Patient Name: Date  of Service: HA NSO Steele Sizer NNEY 02/23/2020 2:45 PM Medical Record Number: [id number] Patient Account Number: [id number] Date of Birth/Sex: Treating RN: Jun 02, 1950 (70 y.o. Lucious Groves Primary Care Provider: PA, Candis Musa RD Other Clinician: Referring Provider: Treating Provider/Extender: Baltazar Najjar PA, GUILFO RD Weeks in Treatment: 0 Active Problems ICD-10 Encounter Code Description Active Date MDM Diagnosis S80.211D Abrasion, right knee, subsequent encounter 02/23/2020 No Yes L98.498 Non-pressure chronic ulcer of skin of other sites with other specified severity 02/23/2020 No Yes Inactive Problems Resolved Problems Electronic Signature(s) Signed: 02/23/2020 5:07:05 PM By: Baltazar Najjar MD Entered By: Baltazar Najjar  on 02/23/2020 15:53:22 -------------------------------------------------------------------------------- Progress Note Details Patient Name: Date of Service: Jeffery Fox 02/23/2020 2:45 PM Medical Record Number: [id number] Patient Account Number: [id number] Date of Birth/Sex: Treating RN: 1950/04/25 (70 y.o. Lucious Groves Primary Care Provider: PA, Candis Musa RD Other Clinician: Referring Provider: Treating Provider/Extender: Baltazar Najjar PA, GUILFO RD Weeks in Treatment: 0 Subjective Chief Complaint Information obtained from Patient 02/23/2020; patient is here for review of a wound on the right medial knee History of Present Illness (HPI) ADMISSION 02/23/2020 This is a 70 year old man who is a resident of [facility]. He is blind and hard of hearing. Very disabled I think because of myelopathy. In fact he is wheelchair-bound and spends only time in wheelchair or in bed. Apparently his problems started at the end of November he fell out of bed and developed a wound on the right medial knee. This healed fairly quickly however he had another fall and the area again was reopened caused by friction on the carpet. Again this wound was  dressed at the facility and again healed up only to open up again within the last week and a half. In fact he saw his primary doctor last week and there was no open wound in place. His situation is complicated by the fact that he is going to outpatient rehab therefore he cannot have home health in the facility based on the lack of insurance coverage. The patient's brother who accompanied him said that there is a LPN and a nurse at [facility] however not totally sure whether they will change the dressing or not. I believe they are using Xeroform and foam but I am not completely sure who is changing it. The brother says he has not had a dressing on in a week Past medical history includes spondylosis with myelopathy apparently at the thoracic and lumbar area, glaucoma cyst causing blindness, major depression, hard of hearing, hypertension, BPH, history of a CVA. He lives at [facility]. Prior to this he lives with a brother who accompanied him today. Patient History Allergies Sulfa (Sulfonamide Antibiotics) (Reaction: rash, hives) Family History Diabetes - Father, Heart Disease - Siblings, Hypertension - Siblings, Kidney Disease - Father, Stroke - Father, No family history of Cancer, Hereditary Spherocytosis, Lung Disease, Seizures, Thyroid Problems, Tuberculosis. Social History Never smoker, Marital Status - Single, Alcohol Use - Never, Drug Use - No History, Caffeine Use - Daily - soda. Medical History Eyes Patient has history of Cataracts, Glaucoma Cardiovascular Patient has history of Hypertension Endocrine Denies history of Type I Diabetes, Type II Diabetes Genitourinary Denies history of End Stage Renal Disease Integumentary (Skin) Denies history of History of Burn Musculoskeletal Patient has history of Osteoarthritis Denies history of Gout, Rheumatoid Arthritis, Osteomyelitis Neurologic Patient has history of Dementia - mild Psychiatric Denies history of  Anorexia/bulimia, Confinement Anxiety Medical A Surgical History Notes nd Eyes blind Ear/Nose/Mouth/Throat hard of hearing Genitourinary BPH Review of Systems (ROS) Constitutional Symptoms (General Health) Denies complaints or symptoms of Fatigue, Fever, Chills, Marked Weight Change. Eyes Complains or has symptoms of Vision Changes. Denies complaints or symptoms of Dry Eyes, Glasses / Contacts. Ear/Nose/Mouth/Throat Denies complaints or symptoms of Chronic sinus problems or rhinitis. Respiratory Denies complaints or symptoms of Chronic or frequent coughs, Shortness of Breath. Cardiovascular Denies complaints or symptoms of Chest pain. Gastrointestinal Denies complaints or symptoms of Frequent diarrhea, Nausea, Vomiting. Endocrine Denies complaints or symptoms of Heat/cold intolerance. Genitourinary Denies complaints or symptoms of Frequent urination. Integumentary (Skin) Complains or  has symptoms of Wounds - right knee. Musculoskeletal Complains or has symptoms of Muscle Weakness. Denies complaints or symptoms of Muscle Pain. Neurologic Denies complaints or symptoms of Numbness/parasthesias. Psychiatric Denies complaints or symptoms of Claustrophobia, Suicidal. Objective Constitutional Sitting or standing Blood Pressure is within target range for patient.. Pulse regular and within target range for patient.Marland Kitchen. Respirations regular, non-labored and within target range.. Temperature is normal and within the target range for the patient.Marland Kitchen. Appears in no distress. Vitals Time Taken: 2:42 PM, Height: 62 in, Source: Stated, Weight: 155 lbs, Source: Stated, BMI: 28.3, Temperature: 98.1 F, Pulse: 65 bpm, Respiratory Rate: 18 breaths/min, Blood Pressure: 105/68 mmHg. Psychiatric Patient did not verbalize with me today. General Notes: Wound exam; the patient has a circular healthy looking wound on the right medial knee. There is this is epithelializing. Under illumination the surface  of the wound looks healthy. No debridement was felt to be necessary. Integumentary (Hair, Skin) Wound #1 status is Open. Original cause of wound was Trauma. The wound is located on the Right Knee. The wound measures 1.7cm length x 1.7cm width x 0.1cm depth; 2.27cm^2 area and 0.227cm^3 volume. There is Fat Layer (Subcutaneous Tissue) exposed. There is no tunneling or undermining noted. There is a medium amount of serosanguineous drainage noted. The wound margin is flat and intact. There is large (67-100%) red, friable granulation within the wound bed. There is a small (1-33%) amount of necrotic tissue within the wound bed including Adherent Slough. Assessment Active Problems ICD-10 Abrasion, right knee, subsequent encounter Non-pressure chronic ulcer of skin of other sites with other specified severity Procedures Wound #1 Pre-procedure diagnosis of Wound #1 is an Abrasion located on the Right Knee . There was a Chemical/Enzymatic/Mechanical debridement performed by Fonnie MuBreedlove, Lauren, RN.. Other agent used was anasept and gauze. A Minimum amount of bleeding was controlled with Pressure. The procedure was tolerated well. Post Debridement Measurements: 1.7cm length x 1.7cm width x 0.1cm depth; 0.227cm^3 volume. Character of Wound/Ulcer Post Debridement is improved. Post procedure Diagnosis Wound #1: Same as Pre-Procedure Plan Follow-up Appointments: Return Appointment in 2 weeks. Bathing/ Shower/ Hygiene: May shower and wash wound with soap and water. Off-Loading: Other: - Keep pressure off of right knee. Additional Orders / Instructions: Other: - If wound dressing becomes soiled or falls off, please re-dress with proper wound care and wound dressing. The following medication(s) was prescribed: lidocaine topical 5 % ointment ointment topical was prescribed at facility WOUND #1: - Knee Wound Laterality: Right Cleanser: Normal Saline (DME) (Generic) Every Other Day/30 Days Discharge  Instructions: Cleanse the wound with Normal Saline prior to applying a clean dressing using gauze sponges, not tissue or cotton balls. Peri-Wound Care: Skin Prep Every Other Day/30 Days Discharge Instructions: Use skin prep as directed Prim Dressing: Fibracol Plus Dressing, 4x4.38 in (collagen) (DME) (Generic) Every Other Day/30 Days ary Discharge Instructions: Moisten collagen with saline or hydrogel. May also use KY jelly. Secondary Dressing: ComfortFoam Border, 3x3 in (silicone border) (DME) (Generic) Every Other Day/30 Days Discharge Instructions: Apply over primary dressing as directed. Secured With: [employer] Roll Sterile, 4.5x3.1 (in/yd) (DME) (Generic) Every Other Day/30 Days Discharge Instructions: Secure with Kerlix as directed. Secured With: 42M Medipore H Soft Cloth Surgical T ape, 2x2 (in/yd) (DME) (Generic) Every Other Day/30 Days Discharge Instructions: Secure dressing with tape as directed. 1. We use moistened collagen, bordered foam and kerlix over this area. This really should heal. 2. We spent time talking about the logistics parts of trying to get this dressing changed  at [facility]. The patient's brother is already aware that if he is going outpatient to physical therapy he will not get home health in the facility. Therefore I wonder whether the nursing staff in the facility will change this dressing every second day but I really have no control over this. 3. We will provide him with a dressing change and order supplies into the home which is actually an assisted living Electronic Signature(s) Signed: 03/01/2020 5:23:51 PM By: Baltazar Najjar MD Signed: 03/11/2020 5:43:51 PM By: Shawn Stall Previous Signature: 02/23/2020 5:07:05 PM Version By: Baltazar Najjar MD Entered By: Shawn Stall on 03/01/2020 10:23:47 -------------------------------------------------------------------------------- HxROS Details Patient Name: Date of Service: HA NSO Fox, [name] 02/23/2020 2:45  PM Medical Record Number: [id number] Patient Account Number: [id number] Date of Birth/Sex: Treating RN: [birth year] (70 y.o. Damaris Schooner Primary Care Provider: PA, Candis Musa RD Other Clinician: Referring Provider: Treating Provider/Extender: Baltazar Najjar PA, GUILFO RD Weeks in Treatment: 0 Constitutional Symptoms (General Health) Complaints and Symptoms: Negative for: Fatigue; Fever; Chills; Marked Weight Change Eyes Complaints and Symptoms: Positive for: Vision Changes Negative for: Dry Eyes; Glasses / Contacts Medical History: Positive for: Cataracts; Glaucoma Past Medical History Notes: blind Ear/Nose/Mouth/Throat Complaints and Symptoms: Negative for: Chronic sinus problems or rhinitis Medical History: Past Medical History Notes: hard of hearing Respiratory Complaints and Symptoms: Negative for: Chronic or frequent coughs; Shortness of Breath Cardiovascular Complaints and Symptoms: Negative for: Chest pain Medical History: Positive for: Hypertension Gastrointestinal Complaints and Symptoms: Negative for: Frequent diarrhea; Nausea; Vomiting Endocrine Complaints and Symptoms: Negative for: Heat/cold intolerance Medical History: Negative for: Type I Diabetes; Type II Diabetes Genitourinary Complaints and Symptoms: Negative for: Frequent urination Medical History: Negative for: End Stage Renal Disease Past Medical History Notes: BPH Integumentary (Skin) Complaints and Symptoms: Positive for: Wounds - right knee Medical History: Negative for: History of Burn Musculoskeletal Complaints and Symptoms: Positive for: Muscle Weakness Negative for: Muscle Pain Medical History: Positive for: Osteoarthritis Negative for: Gout; Rheumatoid Arthritis; Osteomyelitis Neurologic Complaints and Symptoms: Negative for: Numbness/parasthesias Medical History: Positive for: Dementia - mild Psychiatric Complaints and Symptoms: Negative for: Claustrophobia;  Suicidal Medical History: Negative for: Anorexia/bulimia; Confinement Anxiety Hematologic/Lymphatic Immunological Oncologic HBO Extended History Items Eyes: Eyes: Cataracts Glaucoma Immunizations Pneumococcal Vaccine: Received Pneumococcal Vaccination: Yes Implantable Devices No devices added Family and Social History Cancer: No; Diabetes: Yes - Father; Heart Disease: Yes - Siblings; Hereditary Spherocytosis: No; Hypertension: Yes - Siblings; Kidney Disease: Yes - Father; Lung Disease: No; Seizures: No; Stroke: Yes - Father; Thyroid Problems: No; Tuberculosis: No; Never smoker; Marital Status - Single; Alcohol Use: Never; Drug Use: No History; Caffeine Use: Daily - soda; Financial Concerns: No; Food, Clothing or Shelter Needs: No; Support System Lacking: No; Transportation Concerns: No Electronic Signature(s) Signed: 02/23/2020 5:07:05 PM By: Baltazar Najjar MD Signed: 02/23/2020 6:04:09 PM By: Zenaida Deed RN, BSN Entered By: Zenaida Deed on 02/23/2020 15:01:38 -------------------------------------------------------------------------------- SuperBill Details Patient Name: Date of Service: Jeffery Kins, [name] 02/23/2020 Medical Record Number: [id number] Patient Account Number: [id number] Date of Birth/Sex: Treating RN: 02/06/1951 (70 y.o. Lucious Groves Primary Care Provider: PA, Candis Musa RD Other Clinician: Referring Provider: Treating Provider/Extender: Baltazar Najjar PA, GUILFO RD Weeks in Treatment: 0 Diagnosis Coding ICD-10 Codes Code Description S80.211D Abrasion, right knee, subsequent encounter L98.498 Non-pressure chronic ulcer of skin of other sites with other specified severity Facility Procedures Physician Procedures : CPT4 Code Description Modifier [id number] - WC PHYS LEVEL 2 - NEW PT ICD-10 Diagnosis Description S80.211D Abrasion, right knee,  subsequent encounter L98.498 Non-pressure chronic ulcer of skin of other sites with other specified  severity Quantity: 1 Electronic Signature(s) Signed: 02/23/2020 5:07:05 PM By: Baltazar Najjar MD Entered By: Baltazar Najjar on 02/23/2020 16:01:49

## 2020-02-23 NOTE — Progress Notes (Signed)
[facility] ([id number]) Visit Report for 02/23/2020 Abuse/Suicide Risk Screen Details Patient Name: Date of Service: Lillia Dallas 02/23/2020 2:45 PM Medical Record Number: [id number] Patient Account Number: [id number] Date of Birth/Sex: Treating RN: November 22, 1950 (70 y.o. Damaris Schooner Primary Care Ishmael Berkovich: PA, Candis Musa RD Other Clinician: Referring Atilla Zollner: Treating Lucy Woolever/Extender: Baltazar Najjar PA, GUILFO RD Weeks in Treatment: 0 Abuse/Suicide Risk Screen Items Answer ABUSE RISK SCREEN: Has anyone close to you tried to hurt or harm you recentlyo No Do you feel uncomfortable with anyone in your familyo No Has anyone forced you do things that you didnt want to doo No Electronic Signature(s) Signed: 02/23/2020 6:04:09 PM By: Zenaida Deed RN, BSN Entered By: Zenaida Deed on 02/23/2020 15:01:52 -------------------------------------------------------------------------------- Activities of Daily Living Details Patient Name: Date of Service: Lillia Dallas 02/23/2020 2:45 PM Medical Record Number: [id number] Patient Account Number: [id number] Date of Birth/Sex: Treating RN: Jan 21, 1951 (70 y.o. Damaris Schooner Primary Care Quanda Pavlicek: PA, Candis Musa RD Other Clinician: Referring Karie Skowron: Treating Moosa Bueche/Extender: Baltazar Najjar PA, GUILFO RD Weeks in Treatment: 0 Activities of Daily Living Items Answer Activities of Daily Living (Please select one for each item) Drive Automobile Not Able T Medications ake Need Assistance Use T elephone Not Able Care for Appearance Need Assistance Use T oilet Need Assistance Bath / Shower Need Assistance Dress Self Need Assistance Feed Self Need Assistance Walk Not Able Get In / Out Bed Need Assistance Housework Not Able Prepare Meals Not Able Handle Money Not Able Shop for Self Not Able Electronic Signature(s) Signed: 02/23/2020 6:04:09 PM By: Zenaida Deed RN, BSN Entered By: Zenaida Deed on 02/23/2020  15:02:39 -------------------------------------------------------------------------------- Education Screening Details Patient Name: Date of Service: HA NSO Steele Sizer NNEY 02/23/2020 2:45 PM Medical Record Number: [id number] Patient Account Number: [id number] Date of Birth/Sex: Treating RN: Jul 29, 1950 (70 y.o. Damaris Schooner Primary Care Deshanta Lady: PA, Candis Musa RD Other Clinician: Referring Rondy Krupinski: Treating Oniyah Rohe/Extender: Baltazar Najjar PA, GUILFO RD Weeks in Treatment: 0 Primary Learner Assessed: Caregiver brother mentally challenged, hard of Reason Patient is not Primary Learner: hearing Learning Preferences/Education Level/Primary Language Learning Preference: Explanation, Demonstration, Printed Material Highest Education Level: Grade School Preferred Language: Economist Language Barrier: Yes Transport planner Needed: No Memory Deficit: Yes Emotional Barrier: No Cultural/Religious Beliefs Affecting Medical Care: No Physical Barrier Impaired Vision: Yes Legally Blind, blind Impaired Hearing: Yes Hearing Aid, bilateral Decreased Hand dexterity: No Knowledge/Comprehension Knowledge Level: High Comprehension Level: High Ability to understand written instructions: High Ability to understand verbal instructions: High Motivation Anxiety Level: Calm Cooperation: Cooperative Education Importance: Acknowledges Need Interest in Health Problems: Asks Questions Perception: Coherent Willingness to Engage in Self-Management High Activities: Readiness to Engage in Self-Management High Activities: Electronic Signature(s) Signed: 02/23/2020 6:04:09 PM By: Zenaida Deed RN, BSN Entered By: Zenaida Deed on 02/23/2020 15:05:41 -------------------------------------------------------------------------------- Fall Risk Assessment Details Patient Name: Date of Service: HA NSO N, [name] 02/23/2020 2:45 PM Medical Record Number: [id number] Patient Account Number:  [id number] Date of Birth/Sex: Treating RN: 1950/08/26 (70 y.o. Damaris Schooner Primary Care Harwood Nall: PA, Candis Musa RD Other Clinician: Referring Tawanna Funk: Treating Treyshaun Keatts/Extender: Baltazar Najjar PA, GUILFO RD Weeks in Treatment: 0 Fall Risk Assessment Items Have you had 2 or more falls in the last 12 monthso 0 Yes Have you had any fall that resulted in injury in the last 12 monthso 0 Yes FALLS RISK SCREEN History of falling - immediate or within 3 months 25 Yes Secondary diagnosis (Do you have 2 or more medical diagnoseso) 0  No Ambulatory aid None/bed rest/wheelchair/nurse 0 Yes Crutches/cane/walker 0 No Furniture 0 No Intravenous therapy Access/Saline/Heparin Lock 0 No Gait/Transferring Normal/ bed rest/ wheelchair 0 Yes Weak (short steps with or without shuffle, stooped but able to lift head while walking, may seek 0 No support from furniture) Impaired (short steps with shuffle, may have difficulty arising from chair, head down, impaired 0 No balance) Mental Status Oriented to own ability 0 No Electronic Signature(s) Signed: 02/23/2020 6:04:09 PM By: Zenaida Deed RN, BSN Entered By: Zenaida Deed on 02/23/2020 15:06:14 -------------------------------------------------------------------------------- Foot Assessment Details Patient Name: Date of Service: Edrick Kins, [name] 02/23/2020 2:45 PM Medical Record Number: [id number] Patient Account Number: [id number] Date of Birth/Sex: Treating RN: 08/25/1950 (70 y.o. Damaris Schooner Primary Care Roran Wegner: PA, Candis Musa RD Other Clinician: Referring Ellenor Wisniewski: Treating Brienna Bass/Extender: Baltazar Najjar PA, GUILFO RD Weeks in Treatment: 0 Foot Assessment Items Site Locations + = Sensation present, - = Sensation absent, C = Callus, U = Ulcer R = Redness, W = Warmth, M = Maceration, PU = Pre-ulcerative lesion F = Fissure, S = Swelling, D = Dryness Assessment Right: Left: Other Deformity: No No Prior Foot Ulcer: No  No Prior Amputation: No No Charcot Joint: No No Ambulatory Status: Non-ambulatory Assistance Device: Wheelchair Gait: Electronic Signature(s) Signed: 02/23/2020 6:04:09 PM By: Zenaida Deed RN, BSN Entered By: Zenaida Deed on 02/23/2020 15:07:26 -------------------------------------------------------------------------------- Nutrition Risk Screening Details Patient Name: Date of Service: [name] 02/23/2020 2:45 PM Medical Record Number: [id number] Patient Account Number: [id number] Date of Birth/Sex: Treating RN: 23-Jun-1950 (70 y.o. Damaris Schooner Primary Care Unnamed Zeien: PA, GUILFO RD Other Clinician: Referring Tanzie Rothschild: Treating Beth Spackman/Extender: Baltazar Najjar PA, GUILFO RD Weeks in Treatment: 0 Height (in): 62 Weight (lbs): 155 Body Mass Index (BMI): 28.3 Nutrition Risk Screening Items Score Screening NUTRITION RISK SCREEN: I have an illness or condition that made me change the kind and/or amount of food I eat 0 No I eat fewer than two meals per day 0 No I eat few fruits and vegetables, or milk products 0 No I have three or more drinks of beer, liquor or wine almost every day 0 No I have tooth or mouth problems that make it hard for me to eat 0 No I don't always have enough money to buy the food I need 0 No I eat alone most of the time 0 No I take three or more different prescribed or over-the-counter drugs a day 1 Yes Without wanting to, I have lost or gained 10 pounds in the last six months 0 No I am not always physically able to shop, cook and/or feed myself 0 No Nutrition Protocols Good Risk Protocol 0 No interventions needed Moderate Risk Protocol High Risk Proctocol Risk Level: Good Risk Score: 1 Electronic Signature(s) Signed: 02/23/2020 6:04:09 PM By: Zenaida Deed RN, BSN Entered By: Zenaida Deed on 02/23/2020 15:07:16

## 2020-02-25 DIAGNOSIS — R2681 Unsteadiness on feet: Secondary | ICD-10-CM | POA: Diagnosis not present

## 2020-02-25 DIAGNOSIS — R269 Unspecified abnormalities of gait and mobility: Secondary | ICD-10-CM | POA: Diagnosis not present

## 2020-02-25 DIAGNOSIS — F79 Unspecified intellectual disabilities: Secondary | ICD-10-CM | POA: Diagnosis not present

## 2020-02-25 DIAGNOSIS — R531 Weakness: Secondary | ICD-10-CM | POA: Diagnosis not present

## 2020-02-25 NOTE — Progress Notes (Signed)
JEREMAINE, MARAJ ([id number]) Visit Report for 02/23/2020 Allergy List Details Patient Name: Date of Service: Lillia Dallas 02/23/2020 2:45 PM Medical Record Number: [id number] Patient Account Number: [id number] Date of Birth/Sex: Treating RN: [birth year] (70 y.o. Damaris Schooner Primary Care Nova Evett: PA, Candis Musa RD Other Clinician: Referring Zaylon Bossier: Treating Marcellius Montagna/Extender: Baltazar Najjar PA, GUILFO RD Weeks in Treatment: 0 Allergies Active Allergies Sulfa (Sulfonamide Antibiotics) Reaction: rash, hives Type: Allergen Allergy Notes Electronic Signature(s) Signed: 02/23/2020 6:04:09 PM By: Zenaida Deed RN, BSN Entered By: Zenaida Deed on 02/23/2020 14:44:32 -------------------------------------------------------------------------------- Arrival Information Details Patient Name: Date of Service: Edrick Kins, [name] 02/23/2020 2:45 PM Medical Record Number: [id number] Patient Account Number: [id number] Date of Birth/Sex: Treating RN: 1950/02/16 (70 y.o. Damaris Schooner Primary Care Billyjack Trompeter: PA, Candis Musa RD Other Clinician: Referring Jilian West: Treating Aubriegh Minch/Extender: Baltazar Najjar PA, GUILFO RD Weeks in Treatment: 0 Visit Information Patient Arrived: Wheel Chair Arrival Time: 14:34 Accompanied By: brother Transfer Assistance: None Patient Identification Verified: Yes Secondary Verification Process Completed: Yes Patient Requires Transmission-Based Precautions: No Patient Has Alerts: No Notes stayed in wheelchair Electronic Signature(s) Signed: 02/23/2020 6:04:09 PM By: Zenaida Deed RN, BSN Entered By: Zenaida Deed on 02/23/2020 14:42:07 -------------------------------------------------------------------------------- Clinic Level of Care Assessment Details Patient Name: Date of Service: [name] 02/23/2020 2:45 PM Medical Record Number: [id number] Patient Account Number: [id number] Date of Birth/Sex: Treating RN: 23-Feb-1950 (70 y.o. Charlean Merl,  Lauren Primary Care Aliesha Dolata: PA, Candis Musa RD Other Clinician: Referring Synai Prettyman: Treating Isaia Hassell/Extender: Baltazar Najjar PA, GUILFO RD Weeks in Treatment: 0 Clinic Level of Care Assessment Items TOOL 2 Quantity Score X- 1 0 Use when only an EandM is performed on the INITIAL visit ASSESSMENTS - Nursing Assessment / Reassessment X- 1 20 General Physical Exam (combine w/ comprehensive assessment (listed just below) when performed on new pt. evals) X- 1 25 Comprehensive Assessment (HX, ROS, Risk Assessments, Wounds Hx, etc.) ASSESSMENTS - Wound and Skin A ssessment / Reassessment X - Simple Wound Assessment / Reassessment - one wound 1 5 []  - 0 Complex Wound Assessment / Reassessment - multiple wounds X- 1 10 Dermatologic / Skin Assessment (not related to wound area) ASSESSMENTS - Ostomy and/or Continence Assessment and Care []  - 0 Incontinence Assessment and Management []  - 0 Ostomy Care Assessment and Management (repouching, etc.) PROCESS - Coordination of Care X - Simple Patient / Family Education for ongoing care 1 15 []  - 0 Complex (extensive) Patient / Family Education for ongoing care X- 1 10 Staff obtains , Records, T Results / Process Orders est X- 1 10 Staff telephones HHA, Nursing Homes / Clarify orders / etc []  - 0 Routine Transfer to another Facility (non-emergent condition) []  - 0 Routine Hospital Admission (non-emergent condition) X- 1 15 New Admissions / / Ordering NPWT Apligraf, etc. , []  - 0 Emergency Hospital Admission (emergent condition) X- 1 10 Simple Discharge Coordination []  - 0 Complex (extensive) Discharge Coordination PROCESS - Special Needs []  - 0 Pediatric / Minor Patient Management []  - 0 Isolation Patient Management []  - 0 Hearing / Language / Visual special needs []  - 0 Assessment of Community assistance (transportation, D/C planning, etc.) []  - 0 Additional assistance / Altered mentation []  -  0 Support Surface(s) Assessment (bed, cushion, seat, etc.) INTERVENTIONS - Wound Cleansing / Measurement X- 1 5 Wound Imaging (photographs - any number of wounds) []  - 0 Wound Tracing (instead of photographs) X- 1 5 Simple Wound Measurement - one wound []  - 0  Complex Wound Measurement - multiple wounds X- 1 5 Simple Wound Cleansing - one wound []  - 0 Complex Wound Cleansing - multiple wounds INTERVENTIONS - Wound Dressings X - Small Wound Dressing one or multiple wounds 1 10 []  - 0 Medium Wound Dressing one or multiple wounds []  - 0 Large Wound Dressing one or multiple wounds []  - 0 Application of Medications - injection INTERVENTIONS - Miscellaneous []  - 0 External ear exam []  - 0 Specimen Collection (cultures, biopsies, blood, body fluids, etc.) []  - 0 Specimen(s) / Culture(s) sent or taken to Lab for analysis []  - 0 Patient Transfer (multiple staff / / Similar devices) []  - 0 Simple Staple / Suture removal (25 or less) []  - 0 Complex Staple / Suture removal (26 or more) []  - 0 Hypo / Hyperglycemic Management (close monitor of Blood Glucose) []  - 0 Ankle / Brachial Index (ABI) - do not check if billed separately Has the patient been seen at the hospital within the last three years: Yes Total Score: 145 Level Of Care: New/Established - Level 4 Electronic Signature(s) Signed: 02/23/2020 5:35:07 PM By: RN Entered By: on 02/23/2020 15:31:47 -------------------------------------------------------------------------------- Encounter Discharge Information Details Patient Name: Date of Service: HA NSO N, [name] 02/23/2020 2:45 PM Medical Record Number: Patient Account Number: Date of Birth/Sex: Treating RN: 1950-11-20 (70 y.o. Primary Care Sharicka Pogorzelski: PA, RD Other Clinician: Referring Seraphim Affinito: Treating Sunni Richardson/Extender: PA, GUILFO RD Weeks in Treatment:  0 Encounter Discharge Information Items Discharge Condition: Stable Ambulatory Status: Wheelchair Discharge Destination: Home Transportation: Private Auto Schedule Follow-up Appointment: Yes Clinical Summary of Care: Patient Declined Electronic Signature(s) Signed: 02/25/2020 5:28:50 PM By: Fonnie Mu RN, BSN Entered By: Fonnie Mu on 02/23/2020 17:56:38 -------------------------------------------------------------------------------- Lower Extremity Assessment Details Patient Name: Date of Service: HA NSO 04/22/2020 NNEY 02/23/2020 2:45 PM Medical Record Number: [id number] Patient Account Number: 05/16/1950 Date of Birth/Sex: Treating RN: 12/23/1950 (70 y.o. Candis Musa Primary Care Edona Schreffler: Other Clinician: PA, Baltazar Najjar RD Referring Shahiem Bedwell: Treating Meric Joye/Extender: 02/27/2020 PA, GUILFO RD Weeks in Treatment: 0 Edema Assessment Assessed: Zandra Abts: No] Zandra Abts: No] Edema: [Left: N] [Right: o] Electronic Signature(s) Signed: 02/23/2020 6:04:09 PM By: Steele Sizer RN, BSN Entered By: 04/22/2020 on 02/23/2020 15:07:40 -------------------------------------------------------------------------------- Multi Wound Chart Details Patient Name: Date of Service: [id number], [name] 02/23/2020 2:45 PM Medical Record Number: [id number] Patient Account Number: Damaris Schooner Date of Birth/Sex: Treating RN: 10-Feb-1951 (70 y.o. Kyra Searles Primary Care Saleha Kalp: PA, Franne Forts RD Other Clinician: Referring Igor Bishop: Treating Marko Skalski/Extender: 04/22/2020 PA, GUILFO RD Weeks in Treatment: 0 Vital Signs Height(in): 62 Pulse(bpm): 65 Weight(lbs): 155 Blood Pressure(mmHg): 105/68 Body Mass Index(BMI): 28 Temperature(F): 98.1 Respiratory Rate(breaths/min): 18 Photos: [1:No Photos Right Knee] [N/A:N/A N/A] Wound Location: [1:Trauma] [N/A:N/A] Wounding Event: [1:Abrasion] [N/A:N/A] Primary Etiology: [1:Cataracts, Glaucoma, Hypertension, N/A] Comorbid History:  [1:Osteoarthritis, Dementia 01/12/2020] [N/A:N/A] Date Acquired: [1:0] [N/A:N/A] Weeks of Treatment: [1:Open] [N/A:N/A] Wound Status: [1:1.7x1.7x0.1] [N/A:N/A] Measurements L x W x D (cm) [1:2.27] [N/A:N/A] A (cm) : rea [1:0.227] [N/A:N/A] Volume (cm) : [1:Full Thickness Without Exposed] [N/A:N/A] Classification: [1:Support Structures Medium] [N/A:N/A] Exudate Amount: [1:Serosanguineous] [N/A:N/A] Exudate Type: [1:red, brown] [N/A:N/A] Exudate Color: [1:Flat and Intact] [N/A:N/A] Wound Margin: [1:Large (67-100%)] [N/A:N/A] Granulation Amount: [1:Red, Friable] [N/A:N/A] Granulation Quality: [1:Small (1-33%)] [N/A:N/A] Necrotic Amount: [1:Fat Layer (Subcutaneous Tissue): Yes N/A] Exposed Structures: [1:Fascia: No Tendon: No Muscle: No Joint: No Bone: No Small (1-33%)] [N/A:N/A] Treatment Notes Electronic Signature(s) Signed: 02/23/2020  5:07:05 PM By: Baltazar Najjarobson, Michael MD Signed: 02/23/2020 5:35:07 PM By: Fonnie MuBreedlove, Lauren RN Entered By: Baltazar Najjarobson, Michael on 02/23/2020 15:53:29 -------------------------------------------------------------------------------- Multi-Disciplinary Care Plan Details Patient Name: Date of Service: [name], [name] 02/23/2020 2:45 PM Medical Record Number: [id number] Patient Account Number: [id number] Date of Birth/Sex: Treating RN: 07/02/1950 ([age] y.o. Charlean MerlM) Breedlove, Lauren Primary Care Sadi Arave: PA, Candis MusaGUILFO RD Other Clinician: Referring Deny Chevez: Treating Nychelle Cassata/Extender: Baltazar Najjarobson, Michael PA, GUILFO RD Weeks in Treatment: 0 Active Inactive Orientation to the Wound Care Program Nursing Diagnoses: Knowledge deficit related to the wound healing center program Goals: Patient/caregiver will verbalize understanding of the Wound Healing Center Program Date Initiated: 02/23/2020 Target Resolution Date: 03/18/2020 Goal Status: Active Interventions: Provide education on orientation to the wound center Notes: Wound/Skin Impairment Nursing Diagnoses: Impaired  tissue integrity Knowledge deficit related to ulceration/compromised skin integrity Goals: Patient will have a decrease in wound volume by X% from date: (specify in notes) Date Initiated: 02/23/2020 Target Resolution Date: 03/18/2020 Goal Status: Active Patient/caregiver will verbalize understanding of skin care regimen Date Initiated: 02/23/2020 Target Resolution Date: 03/18/2020 Goal Status: Active Ulcer/skin breakdown will have a volume reduction of 30% by week 4 Date Initiated: 02/23/2020 Target Resolution Date: 03/18/2020 Goal Status: Active Interventions: Assess patient/caregiver ability to obtain necessary supplies Assess patient/caregiver ability to perform ulcer/skin care regimen upon admission and as needed Assess ulceration(s) every visit Notes: Electronic Signature(s) Signed: 02/23/2020 5:35:07 PM By: Fonnie MuBreedlove, Lauren RN Entered By: Fonnie MuBreedlove, Lauren on 02/23/2020 15:21:50 -------------------------------------------------------------------------------- Pain Assessment Details Patient Name: Date of Service: Darcella GasmanHA NSO N, [name] 02/23/2020 2:45 PM Medical Record Number: [id number] Patient Account Number: [id number] Date of Birth/Sex: Treating RN: 03/09/1950 (70 y.o. Damaris SchoonerM) Boehlein, Linda Primary Care Alyviah Crandle: Other Clinician: PA, Candis MusaGUILFO RD Referring Abdoulie Tierce: Treating Dorlis Judice/Extender: Baltazar Najjarobson, Michael PA, GUILFO RD Weeks in Treatment: 0 Active Problems Location of Pain Severity and Description of Pain Patient Has Paino No Site Locations Rate the pain. Current Pain Level: 0 Pain Management and Medication Current Pain Management: Electronic Signature(s) Signed: 02/23/2020 6:04:09 PM By: Zenaida DeedBoehlein, Linda RN, BSN Entered By: Zenaida DeedBoehlein, Linda on 02/23/2020 15:09:37 -------------------------------------------------------------------------------- Patient/Caregiver Education Details Patient Name: Date of Service: Lillia DallasHA NSO N, [name] 1/11/2022andnbsp2:45 PM Medical Record Number:  [id number] Patient Account Number: [id number] Date of Birth/Gender: Treating RN: 01/08/1951 (70 y.o. Lucious GrovesM) Breedlove, Lauren Primary Care Physician: PA, Candis MusaGUILFO RD Other Clinician: Referring Physician: Treating Physician/Extender: Jeneen Montgomeryobson, Michael PA, GUILFO RD Weeks in Treatment: 0 Education Assessment Education Provided To: Patient Education Topics Provided Welcome [facility]: o Handouts: Welcome T [facility] o Methods: Explain/Verbal Responses: State content correctly Electronic Signature(s) Signed: 02/23/2020 5:35:07 PM By: Fonnie MuBreedlove, Lauren RN Entered By: Fonnie MuBreedlove, Lauren on 02/23/2020 15:22:03 -------------------------------------------------------------------------------- Wound Assessment Details Patient Name: Date of Service: Darcella GasmanHA NSO N, [name] 02/23/2020 2:45 PM Medical Record Number: [id number] Patient Account Number: [id number] Date of Birth/Sex: Treating RN: 05/19/1950 (70 y.o. Damaris SchoonerM) Boehlein, Linda Primary Care Jennah Satchell: PA, GUILFO RD Other Clinician: Referring Marra Fraga: Treating Viola Placeres/Extender: Baltazar Najjarobson, Michael PA, GUILFO RD Weeks in Treatment: 0 Wound Status Wound Number: 1 Primary Etiology: Abrasion Wound Location: Right Knee Wound Status: Open Wounding Event: Trauma Comorbid Cataracts, Glaucoma, Hypertension, Osteoarthritis, History: Dementia Date Acquired: 01/12/2020 Weeks Of Treatment: 0 Clustered Wound: No Wound Measurements Length: (cm) 1.7 Width: (cm) 1.7 Depth: (cm) 0.1 Area: (cm) 2.27 Volume: (cm) 0.227 % Reduction in Area: % Reduction in Volume: Epithelialization: Small (1-33%) Tunneling: No Undermining: No Wound Description Classification: Full Thickness Without Exposed Support Structures Wound Margin: Flat and Intact  Exudate Amount: Medium Exudate Type: Serosanguineous Exudate Color: red, brown Foul Odor After Cleansing: No Slough/Fibrino Yes Wound Bed Granulation Amount: Large (67-100%) Exposed  Structure Granulation Quality: Red, Friable Fascia Exposed: No Necrotic Amount: Small (1-33%) Fat Layer (Subcutaneous Tissue) Exposed: Yes Necrotic Quality: Adherent Slough Tendon Exposed: No Muscle Exposed: No Joint Exposed: No Bone Exposed: No Treatment Notes Wound #1 (Knee) Wound Laterality: Right Cleanser Normal Saline Discharge Instruction: Cleanse the wound with Normal Saline prior to applying a clean dressing using gauze sponges, not tissue or cotton balls. Peri-Wound Care Skin Prep Discharge Instruction: Use skin prep as directed Topical Primary Dressing Fibracol Plus Dressing, 4x4.38 in (collagen) Discharge Instruction: Moisten collagen with saline or hydrogel. May also use KY jelly. Secondary Dressing ComfortFoam Border, 3x3 in (silicone border) Discharge Instruction: Apply over primary dressing as directed. Secured With [employer], 4.5x3.1 (in/yd) Discharge Instruction: Secure with Kerlix as directed. 58M Medipore H Soft Cloth Surgical T ape, 2x2 (in/yd) Discharge Instruction: Secure dressing with tape as directed. Compression Wrap Compression Stockings Add-Ons Electronic Signature(s) Signed: 02/23/2020 6:04:09 PM By: Zenaida Deed RN, BSN Entered By: Zenaida Deed on 02/23/2020 15:09:19 -------------------------------------------------------------------------------- Vitals Details Patient Name: Date of Service: HA NSO Dorris Carnes, [name] 02/23/2020 2:45 PM Medical Record Number: [id number] Patient Account Number: [id number] Date of Birth/Sex: Treating RN: 1950/03/28 (70 y.o. Damaris Schooner Primary Care Kyros Salzwedel: PA, GUILFO RD Other Clinician: Referring Jaxsen Bernhart: Treating Abiageal Blowe/Extender: Baltazar Najjar PA, GUILFO RD Weeks in Treatment: 0 Vital Signs Time Taken: 14:42 Temperature (F): 98.1 Height (in): 62 Pulse (bpm): 65 Source: Stated Respiratory Rate (breaths/min): 18 Weight (lbs): 155 Blood Pressure (mmHg): 105/68 Source:  Stated Reference Range: 80 - 120 mg / dl Body Mass Index (BMI): 28.3 Electronic Signature(s) Signed: 02/23/2020 6:04:09 PM By: Zenaida Deed RN, BSN Entered By: Zenaida Deed on 02/23/2020 14:43:04

## 2020-03-03 DIAGNOSIS — F79 Unspecified intellectual disabilities: Secondary | ICD-10-CM | POA: Diagnosis not present

## 2020-03-03 DIAGNOSIS — R269 Unspecified abnormalities of gait and mobility: Secondary | ICD-10-CM | POA: Diagnosis not present

## 2020-03-03 DIAGNOSIS — R531 Weakness: Secondary | ICD-10-CM | POA: Diagnosis not present

## 2020-03-03 DIAGNOSIS — R2681 Unsteadiness on feet: Secondary | ICD-10-CM | POA: Diagnosis not present

## 2020-03-08 ENCOUNTER — Other Ambulatory Visit: Payer: Self-pay

## 2020-03-08 ENCOUNTER — Encounter (HOSPITAL_BASED_OUTPATIENT_CLINIC_OR_DEPARTMENT_OTHER): Payer: Medicare Other | Admitting: Internal Medicine

## 2020-03-08 DIAGNOSIS — H9190 Unspecified hearing loss, unspecified ear: Secondary | ICD-10-CM | POA: Diagnosis not present

## 2020-03-08 DIAGNOSIS — L98498 Non-pressure chronic ulcer of skin of other sites with other specified severity: Secondary | ICD-10-CM | POA: Diagnosis not present

## 2020-03-08 DIAGNOSIS — Z8673 Personal history of transient ischemic attack (TIA), and cerebral infarction without residual deficits: Secondary | ICD-10-CM | POA: Diagnosis not present

## 2020-03-08 DIAGNOSIS — R2681 Unsteadiness on feet: Secondary | ICD-10-CM | POA: Diagnosis not present

## 2020-03-08 DIAGNOSIS — I10 Essential (primary) hypertension: Secondary | ICD-10-CM | POA: Diagnosis not present

## 2020-03-08 DIAGNOSIS — R531 Weakness: Secondary | ICD-10-CM | POA: Diagnosis not present

## 2020-03-08 DIAGNOSIS — Z993 Dependence on wheelchair: Secondary | ICD-10-CM | POA: Diagnosis not present

## 2020-03-08 DIAGNOSIS — H547 Unspecified visual loss: Secondary | ICD-10-CM | POA: Diagnosis not present

## 2020-03-08 DIAGNOSIS — R269 Unspecified abnormalities of gait and mobility: Secondary | ICD-10-CM | POA: Diagnosis not present

## 2020-03-08 DIAGNOSIS — F79 Unspecified intellectual disabilities: Secondary | ICD-10-CM | POA: Diagnosis not present

## 2020-03-08 NOTE — Progress Notes (Signed)
MANA, MORISON ([id number]) Visit Report for 03/08/2020 Fall Risk Assessment Details Patient Name: Date of Service: [name] 03/08/2020 2:00 PM Medical Record Number: [id number] Patient Account Number: [id number] Date of Birth/Sex: Treating RN: 03-Jan-1951 (70 y.o. Harlon Flor, Millard.Loa Primary Care Isamar Nazir: PA, GUILFO RD Other Clinician: Referring Breyah Akhter: Treating Axl Rodino/Extender: Baltazar Najjar PA, GUILFO RD Weeks in Treatment: 2 Fall Risk Assessment Items Have you had 2 or more falls in the last 12 monthso 0 Yes Have you had any fall that resulted in injury in the last 12 monthso 0 No FALLS RISK SCREEN History of falling - immediate or within 3 months 25 Yes Secondary diagnosis (Do you have 2 or more medical diagnoseso) 15 Yes Ambulatory aid None/bed rest/wheelchair/nurse 0 Yes Crutches/cane/walker 0 No Furniture 0 No Intravenous therapy Access/Saline/Heparin Lock 0 No Gait/Transferring Normal/ bed rest/ wheelchair 0 Yes Weak (short steps with or without shuffle, stooped but able to lift head while walking, may seek 0 No support from furniture) Impaired (short steps with shuffle, may have difficulty arising from chair, head down, impaired 0 No balance) Mental Status Oriented to own ability 0 No Electronic Signature(s) Signed: 03/08/2020 6:32:06 PM By: Shawn Stall Entered By: Shawn Stall on 03/08/2020 14:19:43

## 2020-03-09 NOTE — Progress Notes (Signed)
[name], Devyon ([id number]) Visit Report for 03/08/2020 Debridement Details Patient Name: Date of Service: Darcella GasmanHA NSO N, [name] 03/08/2020 2:00 PM Medical Record Number: [id number] Patient Account Number: [id number] Date of Birth/Sex: Treating RN: 08/15/1950 (70 y.o. Lucious GrovesM) Breedlove, Lauren Primary Care Provider: PA, Candis MusaGUILFO RD Other Clinician: Referring Provider: Treating Provider/Extender: Baltazar Najjarobson, Michael PA, GUILFO RD Weeks in Treatment: 2 Debridement Performed for Assessment: Wound #2 Right,Lateral Knee Performed By: Physician Maxwell Caulobson, Michael G., MD Debridement Type: Debridement Level of Consciousness (Pre-procedure): Awake and Alert Pre-procedure Verification/Time Out Yes - 14:39 Taken: Start Time: 14:39 Pain Control: Lidocaine T Area Debrided (L x W): otal 1.4 (cm) x 1 (cm) = 1.4 (cm) Tissue and other material debrided: Viable, Non-Viable, Slough, Skin: Dermis , Skin: Epidermis, Slough Level: Skin/Epidermis Debridement Description: Selective/Open Wound Instrument: Curette Bleeding: Minimum Hemostasis Achieved: Pressure End Time: 14:40 Procedural Pain: 0 Post Procedural Pain: 0 Response to Treatment: Procedure was tolerated well Level of Consciousness (Post- Awake and Alert procedure): Post Debridement Measurements of Total Wound Length: (cm) 1.4 Width: (cm) 1 Depth: (cm) 0.1 Volume: (cm) 0.11 Character of Wound/Ulcer Post Debridement: Improved Post Procedure Diagnosis Same as Pre-procedure Electronic Signature(s) Signed: 03/08/2020 5:14:25 PM By: Baltazar Najjarobson, Michael MD Signed: 03/09/2020 5:56:51 PM By: Fonnie MuBreedlove, Lauren RN Entered By: Baltazar Najjarobson, Michael on 03/08/2020 14:46:53 -------------------------------------------------------------------------------- HPI Details Patient Name: Date of Service: HA NSO [name], [name] 03/08/2020 2:00 PM Medical Record Number: [id number] Patient Account Number: [id number] Date of Birth/Sex: Treating RN: 12/25/1950 (70 y.o. Lucious GrovesM) Breedlove,  Lauren Primary Care Provider: PA, Candis MusaGUILFO RD Other Clinician: Referring Provider: Treating Provider/Extender: Baltazar Najjarobson, Michael PA, GUILFO RD Weeks in Treatment: 2 History of Present Illness HPI Description: ADMISSION 02/23/2020 This is a 70 year old [facility] He is blind and hard of hearing. Very disabled I think because of myelopathy. In fact he is wheelchair-bound and spends only time in wheelchair or in bed. Apparently his problems started at the end of November he fell out of bed and developed a wound on the right medial knee. This healed fairly quickly however he had another fall and the area again was reopened caused by friction on the carpet. Again this wound was dressed at the facility and again healed up only to open up again within the last week and a half. In fact he saw his primary doctor last week and there was no open wound in place. His situation is complicated by the fact that he is going to outpatient rehab therefore he cannot have home health in the facility based on the lack of insurance coverage. The patient's brother who accompanied him said that [facility] however not totally sure whether they will change the dressing or not. I believe they are using Xeroform and foam but I am not completely sure who is changing it. The brother says he has not had a dressing on in a week Past medical history includes spondylosis with myelopathy apparently at the thoracic and lumbar area, glaucoma cyst causing blindness, major depression, hard of hearing, hypertension, BPH, history of a CVA. [facility] Prior to this he lives with a brother who accompanied him today. 1/25; the patient came in with an abrasion on his medial right knee 2 weeks ago. We have been using silver collagen and foam changing every second day apparently the [facility]. Comes in today with  a new abrasion on the right lateral knee presumably trauma although his brother is not aware of exactly when and how that occurred Electronic Signature(s) Signed: 03/08/2020 5:14:25 PM By: Baltazar Najjar MD Entered By: Baltazar Najjar on 03/08/2020 14:47:39 -------------------------------------------------------------------------------- Physical Exam Details Patient Name: Date of Service: HA NSO N, [name] 03/08/2020 2:00 PM Medical Record Number: [id number] Patient Account Number: [id number] Date of Birth/Sex: Treating RN: 1950/12/16 (70 y.o. Lucious Groves Primary Care Provider: PA, Candis Musa RD Other Clinician: Referring Provider: Treating Provider/Extender: Baltazar Najjar PA, GUILFO RD Weeks in Treatment: 2 Constitutional Sitting or standing Blood Pressure is within target range for patient.. Pulse regular and within target range for patient.Marland Kitchen Respirations regular, non-labored and within target range.. Temperature is normal and within the target range for the patient.Marland Kitchen Appears in no distress. Notes Wound exam; the patient has circular healthy looking wound on the right medial knee this is gone down by perhaps two thirds in diameter. HOWEVER he has a new open area on the lateral side. Nonviable eschar that I removed. No evidence of surrounding infection. He has some skin discolorationo Bruising in both areas of his knee presumably trauma related Electronic Signature(s) Signed: 03/08/2020 5:14:25 PM By: Baltazar Najjar MD Entered By: Baltazar Najjar on 03/08/2020 14:48:39 -------------------------------------------------------------------------------- Physician Orders Details Patient Name: Date of Service: HA NSO N, [name] 03/08/2020 2:00 PM Medical Record Number: [id number] Patient Account Number: [id number] Date of Birth/Sex: Treating RN: [birth year] (70 y.o. Lucious Groves Primary Care Provider: PA, Candis Musa RD Other Clinician: Referring  Provider: Treating Provider/Extender: Baltazar Najjar PA, GUILFO RD Weeks in Treatment: 2 Verbal / Phone Orders: No Diagnosis Coding Follow-up Appointments Return appointment in 3 weeks. Bathing/ Shower/ Hygiene May shower and wash wound with soap and water. Off-Loading Other: - Keep pressure off of right knee. Additional Orders / Instructions Other: - If wound dressing becomes soiled or falls off, please re-dress with proper wound care and wound dressing. Wound Treatment Wound #1 - Knee Wound Laterality: Right Cleanser: Normal Saline (Generic) Every Other Day/30 Days Discharge Instructions: Cleanse the wound with Normal Saline prior to applying a clean dressing using gauze sponges, not tissue or cotton balls. Peri-Wound Care: Skin Prep Every Other Day/30 Days Discharge Instructions: Use skin prep as directed Prim Dressing: Fibracol Plus Dressing, 4x4.38 in (collagen) (Generic) Every Other Day/30 Days ary Discharge Instructions: Moisten collagen with saline or hydrogel. May also use KY jelly. Secondary Dressing: ComfortFoam Border, 3x3 in (silicone border) (Generic) Every Other Day/30 Days Discharge Instructions: Apply over primary dressing as directed. Secured With: [employer], 4.5x3.1 (in/yd) (Generic) Every Other Day/30 Days Discharge Instructions: Secure with Kerlix as directed. Secured With: 63M Medipore H Soft Cloth Surgical Tape, 2x2 (in/yd) (Generic) Every Other Day/30 Days Discharge Instructions: Secure dressing with tape as directed. Wound #2 - Knee Wound Laterality: Right, Lateral Cleanser: Normal Saline (Generic) Every Other Day/30 Days Discharge Instructions: Cleanse the wound with Normal Saline prior to applying a clean dressing using gauze sponges, not tissue or cotton balls. Peri-Wound Care: Skin Prep Every Other Day/30 Days Discharge Instructions: Use skin prep as directed Prim Dressing: Fibracol Plus Dressing, 4x4.38 in (collagen) (Generic) Every Other  Day/30 Days ary Discharge Instructions: Moisten collagen with saline or hydrogel. May also use KY jelly. Secondary Dressing: ComfortFoam Border, 3x3 in (silicone border) (Generic) Every Other Day/30 Days Discharge Instructions: Apply over primary dressing as directed. Secured With: [employer], 4.5x3.1 (in/yd) (Generic) Every Other Day/30 Days Discharge Instructions: Secure with Kerlix as directed. Secured  With: 36M Medipore H Soft Cloth Surgical Tape, 2x2 (in/yd) (Generic) Every Other Day/30 Days Discharge Instructions: Secure dressing with tape as directed. Electronic Signature(s) Signed: 03/08/2020 5:14:25 PM By: Baltazar Najjar MD Signed: 03/09/2020 5:56:51 PM By: Fonnie Mu RN Entered By: Fonnie Mu on 03/08/2020 14:42:58 -------------------------------------------------------------------------------- Problem List Details Patient Name: Date of Service: HA NSO N, [name] 03/08/2020 2:00 PM Medical Record Number: [id number] Patient Account Number: [id number] Date of Birth/Sex: Treating RN: [birth year] (70 y.o. Lucious Groves Primary Care Provider: PA, Candis Musa RD Other Clinician: Referring Provider: Treating Provider/Extender: Baltazar Najjar PA, GUILFO RD Weeks in Treatment: 2 Active Problems ICD-10 Encounter Code Description Active Date MDM Diagnosis S80.211D Abrasion, right knee, subsequent encounter 02/23/2020 No Yes L98.498 Non-pressure chronic ulcer of skin of other sites with other specified severity 02/23/2020 No Yes Inactive Problems Resolved Problems Electronic Signature(s) Signed: 03/08/2020 5:14:25 PM By: Baltazar Najjar MD Entered By: Baltazar Najjar on 03/08/2020 14:46:33 -------------------------------------------------------------------------------- Progress Note Details Patient Name: Date of Service: HA NSO Dorris Carnes, [name] 03/08/2020 2:00 PM Medical Record Number: [id number] Patient Account Number: [id number] Date of Birth/Sex: Treating  RN: 02-21-1950 (70 y.o. Lucious Groves Primary Care Provider: PA, Candis Musa RD Other Clinician: Referring Provider: Treating Provider/Extender: Baltazar Najjar PA, GUILFO RD Weeks in Treatment: 2 Subjective History of Present Illness (HPI) ADMISSION 02/23/2020 This is a 70 year old man who is a resident of [address] [facility]. He is blind and hard of hearing. Very disabled I think because of myelopathy. In fact he is wheelchair-bound and spends only time in wheelchair or in bed. Apparently his problems started at the end of November he fell out of bed and developed a wound on the right medial knee. This healed fairly quickly however he had another fall and the area again was reopened caused by friction on the carpet. Again this wound was dressed at the facility and again healed up only to open up again within the last week and a half. In fact he saw his primary doctor last week and there was no open wound in place. His situation is complicated by the fact that he is going to outpatient rehab therefore he cannot have home health in the facility based on the lack of insurance coverage. The patient's brother who accompanied him said that there is a LPN and a nurse at [facility] however not totally sure whether they will change the dressing or not. I believe they are using Xeroform and foam but I am not completely sure who is changing it. The brother says he has not had a dressing on in a week Past medical history includes spondylosis with myelopathy apparently at the thoracic and lumbar area, glaucoma cyst causing blindness, major depression, hard of hearing, hypertension, BPH, history of a CVA. He lives at [facility]. Prior to this he lives with a brother who accompanied him today. 1/25; the patient came in with an abrasion on his medial right knee 2 weeks ago. We have been using silver collagen and foam changing every second day apparently the dressing is being changed  by the nurse at [facility] rather than home health. Comes in today with a new abrasion on the right lateral knee presumably trauma although his brother is not aware of exactly when and how that occurred Objective Constitutional Sitting or standing Blood Pressure is within target range for patient.. Pulse regular and within target range for patient.Marland Kitchen Respirations regular, non-labored and within target range.. Temperature is normal and within the target range for  the patient.Marland Kitchen Appears in no distress. Vitals Time Taken: 2:03 PM, Height: 62 in, Weight: 155 lbs, BMI: 28.3, Temperature: 98.4 F, Pulse: 73 bpm, Respiratory Rate: 18 breaths/min, Blood Pressure: 114/66 mmHg. General Notes: Wound exam; the patient has circular healthy looking wound on the right medial knee this is gone down by perhaps two thirds in diameter. HOWEVER he has a new open area on the lateral side. Nonviable eschar that I removed. No evidence of surrounding infection. He has some skin discolorationo Bruising in both areas of his knee presumably trauma related Integumentary (Hair, Skin) Wound #1 status is Open. Original cause of wound was Trauma. The wound is located on the Right Knee. The wound measures 0.2cm length x 0.2cm width x 0.1cm depth; 0.031cm^2 area and 0.003cm^3 volume. There is Fat Layer (Subcutaneous Tissue) exposed. There is no tunneling or undermining noted. There is a medium amount of serosanguineous drainage noted. The wound margin is flat and intact. There is large (67-100%) red granulation within the wound bed. There is no necrotic tissue within the wound bed. Wound #2 status is Open. Original cause of wound was Gradually Appeared. The wound is located on the Right,Lateral Knee. The wound measures 1.4cm length x 1cm width x 0.1cm depth; 1.1cm^2 area and 0.11cm^3 volume. There is no tunneling or undermining noted. There is a none present amount of drainage noted. The wound margin is distinct with the  outline attached to the wound base. There is a large (67-100%) amount of necrotic tissue within the wound bed including Eschar. General Notes: scabbed over. Assessment Active Problems ICD-10 Abrasion, right knee, subsequent encounter Non-pressure chronic ulcer of skin of other sites with other specified severity Procedures Wound #2 Pre-procedure diagnosis of Wound #2 is an Abrasion located on the Right,Lateral Knee . There was a Selective/Open Wound Skin/Epidermis Debridement with a total area of 1.4 sq cm performed by Maxwell Caul., MD. With the following instrument(s): Curette to remove Viable and Non-Viable tissue/material. Material removed includes Slough, Skin: Dermis, and Skin: Epidermis after achieving pain control using Lidocaine. No specimens were taken. A time out was conducted at 14:39, prior to the start of the procedure. A Minimum amount of bleeding was controlled with Pressure. The procedure was tolerated well with a pain level of 0 throughout and a pain level of 0 following the procedure. Post Debridement Measurements: 1.4cm length x 1cm width x 0.1cm depth; 0.11cm^3 volume. Character of Wound/Ulcer Post Debridement is improved. Post procedure Diagnosis Wound #2: Same as Pre-Procedure Plan Follow-up Appointments: Return appointment in 3 weeks. Bathing/ Shower/ Hygiene: May shower and wash wound with soap and water. Off-Loading: Other: - Keep pressure off of right knee. Additional Orders / Instructions: Other: - If wound dressing becomes soiled or falls off, please re-dress with proper wound care and wound dressing. WOUND #1: - Knee Wound Laterality: Right Cleanser: Normal Saline (Generic) Every Other Day/30 Days Discharge Instructions: Cleanse the wound with Normal Saline prior to applying a clean dressing using gauze sponges, not tissue or cotton balls. Peri-Wound Care: Skin Prep Every Other Day/30 Days Discharge Instructions: Use skin prep as directed Prim  Dressing: Fibracol Plus Dressing, 4x4.38 in (collagen) (Generic) Every Other Day/30 Days ary Discharge Instructions: Moisten collagen with saline or hydrogel. May also use KY jelly. Secondary Dressing: ComfortFoam Border, 3x3 in (silicone border) (Generic) Every Other Day/30 Days Discharge Instructions: Apply over primary dressing as directed. Secured With: [employer], 4.5x3.1 (in/yd) (Generic) Every Other Day/30 Days Discharge Instructions: Secure with Kerlix as directed.  Secured With: 32M Medipore H Soft Cloth Surgical T ape, 2x2 (in/yd) (Generic) Every Other Day/30 Days Discharge Instructions: Secure dressing with tape as directed. WOUND #2: - Knee Wound Laterality: Right, Lateral Cleanser: Normal Saline (Generic) Every Other Day/30 Days Discharge Instructions: Cleanse the wound with Normal Saline prior to applying a clean dressing using gauze sponges, not tissue or cotton balls. Peri-Wound Care: Skin Prep Every Other Day/30 Days Discharge Instructions: Use skin prep as directed Prim Dressing: Fibracol Plus Dressing, 4x4.38 in (collagen) (Generic) Every Other Day/30 Days ary Discharge Instructions: Moisten collagen with saline or hydrogel. May also use KY jelly. Secondary Dressing: ComfortFoam Border, 3x3 in (silicone border) (Generic) Every Other Day/30 Days Discharge Instructions: Apply over primary dressing as directed. Secured With: [employer], 4.5x3.1 (in/yd) (Generic) Every Other Day/30 Days Discharge Instructions: Secure with Kerlix as directed. Secured With: 32M Medipore H Soft Cloth Surgical T ape, 2x2 (in/yd) (Generic) Every Other Day/30 Days Discharge Instructions: Secure dressing with tape as directed. 1. Continue with collagen border foam to both wound areas 2. Follow-up 3 weeks Electronic Signature(s) Signed: 03/08/2020 5:14:25 PM By: Baltazar Najjar MD Entered By: Baltazar Najjar on 03/08/2020  14:49:09 -------------------------------------------------------------------------------- SuperBill Details Patient Name: Date of Service: [name] 03/08/2020 Medical Record Number: [id number] Patient Account Number: [id number] Date of Birth/Sex: Treating RN: 20-Dec-1950 (70 y.o. Lucious Groves Primary Care Provider: PA, Candis Musa RD Other Clinician: Referring Provider: Treating Provider/Extender: Baltazar Najjar PA, GUILFO RD Weeks in Treatment: 2 Diagnosis Coding ICD-10 Codes Code Description S80.211D Abrasion, right knee, subsequent encounter L98.498 Non-pressure chronic ulcer of skin of other sites with other specified severity Facility Procedures CPT4 Code: [id number] Description: 97597 - DEBRIDE WOUND 1ST 20 SQ CM OR < ICD-10 Diagnosis Description L98.498 Non-pressure chronic ulcer of skin of other sites with other specified severity Modifier: Quantity: 1 Physician Procedures : CPT4 Code Description Modifier [id number] - WC PHYS DEBR WO ANESTH 20 SQ CM ICD-10 Diagnosis Description L98.498 Non-pressure chronic ulcer of skin of other sites with other specified severity Quantity: 1 Electronic Signature(s) Signed: 03/08/2020 5:14:25 PM By: Baltazar Najjar MD Entered By: Baltazar Najjar on 03/08/2020 14:49:23

## 2020-03-09 NOTE — Progress Notes (Signed)
SARIM, ROTHMAN ([id number]) Visit Report for 03/08/2020 Arrival Information Details Patient Name: Date of Service: [name] 03/08/2020 2:00 PM Medical Record Number: [id number] Patient Account Number: [id number] Date of Birth/Sex: Treating RN: January 28, 1951 (70 y.o. Charlean Merl, Lauren Primary Care Isador Castille: PA, GUILFO RD Other Clinician: Referring Nivek Powley: Treating Rosan Calbert/Extender: Baltazar Najjar PA, GUILFO RD Weeks in Treatment: 2 Visit Information History Since Last Visit Added or deleted any medications: No Patient Arrived: Wheel Chair Any new allergies or adverse reactions: No Arrival Time: 14:02 Had a fall or experienced change in Yes Accompanied By: brother activities of daily living that may affect Transfer Assistance: None risk of falls: Patient Identification Verified: Yes Signs or symptoms of abuse/neglect since last visito No Secondary Verification Process Completed: Yes Hospitalized since last visit: No Patient Requires Transmission-Based Precautions: No Implantable device outside of the clinic excluding No Patient Has Alerts: No cellular tissue based products placed in the center since last visit: Has Dressing in Place as Prescribed: Yes Pain Present Now: Yes Notes Brother states the facility notified him and said his brother Mr.Hoes had two falls one fell out of his chair and then rolled out of bed. Has a small scab on R lateral knee but does not know if that a result from either falls. Over all Mr.Folds is Okay. I asked him about pain and he said no pain. Electronic Signature(s) Signed: 03/08/2020 3:15:52 PM By: Karl Ito Entered By: Karl Ito on 03/08/2020 14:10:28 -------------------------------------------------------------------------------- Encounter Discharge Information Details Patient Name: Date of Service: HA NSO Steele Sizer NNEY 03/08/2020 2:00 PM Medical Record Number: [id number] Patient Account Number: [id number] Date of Birth/Sex:  Treating RN: [birth year] (70 y.o. Tammy Sours Primary Care Gardy Montanari: PA, Candis Musa RD Other Clinician: Referring Mervin Ramires: Treating Breya Cass/Extender: Baltazar Najjar PA, GUILFO RD Weeks in Treatment: 2 Encounter Discharge Information Items Post Procedure Vitals Discharge Condition: Stable Temperature (F): 98.4 Ambulatory Status: Wheelchair Pulse (bpm): 73 Discharge Destination: Home Respiratory Rate (breaths/min): 18 Transportation: Private Auto Blood Pressure (mmHg): 114/66 Accompanied By: family member Schedule Follow-up Appointment: Yes Clinical Summary of Care: Electronic Signature(s) Signed: 03/08/2020 6:32:06 PM By: Shawn Stall Entered By: Shawn Stall on 03/08/2020 17:56:38 -------------------------------------------------------------------------------- Lower Extremity Assessment Details Patient Name: Date of Service: [name] 03/08/2020 2:00 PM Medical Record Number: [id number] Patient Account Number: [id number] Date of Birth/Sex: Treating RN: 1950/07/04 (70 y.o. Tammy Sours Primary Care Monico Sudduth: PA, Candis Musa RD Other Clinician: Referring Rishaan Gunner: Treating Conrad Zajkowski/Extender: Baltazar Najjar PA, GUILFO RD Weeks in Treatment: 2 Electronic Signature(s) Signed: 03/08/2020 6:32:06 PM By: Shawn Stall Entered By: Shawn Stall on 03/08/2020 14:17:28 -------------------------------------------------------------------------------- Multi Wound Chart Details Patient Name: Date of Service: Edrick Kins, [name] 03/08/2020 2:00 PM Medical Record Number: [id number] Patient Account Number: [id number] Date of Birth/Sex: Treating RN: [birth year] (70 y.o. Charlean Merl, Lauren Primary Care Lamone Ferrelli: PA, GUILFO RD Other Clinician: Referring Jash Wahlen: Treating Everlynn Sagun/Extender: Baltazar Najjar PA, GUILFO RD Weeks in Treatment: 2 Vital Signs Height(in): 62 Pulse(bpm): 73 Weight(lbs): 155 Blood Pressure(mmHg): 114/66 Body Mass Index(BMI): 28 Temperature(F):  98.4 Respiratory Rate(breaths/min): 18 Photos: [1:No Photos Right Knee] [2:No Photos Right, Lateral Knee] [N/A:N/A N/A] Wound Location: [1:Trauma] [2:Gradually Appeared] [N/A:N/A] Wounding Event: [1:Abrasion] [2:Abrasion] [N/A:N/A] Primary Etiology: [1:Cataracts, Glaucoma, Hypertension, Cataracts, Glaucoma, Hypertension,] [N/A:N/A] Comorbid History: [1:Osteoarthritis, Dementia 01/12/2020] [2:Osteoarthritis, Dementia 03/08/2020] [N/A:N/A] Date Acquired: [1:2] [2:0] [N/A:N/A] Weeks of Treatment: [1:Open] [2:Open] [N/A:N/A] Wound Status: [1:0.2x0.2x0.1] [2:1.4x1x0.1] [N/A:N/A] Measurements L x W x D (cm) [1:0.031] [2:1.1] [N/A:N/A] A (cm) : rea [1:0.003] [2:0.11] [N/A:N/A] Volume (  cm) : [1:98.60%] [2:0.00%] [N/A:N/A] % Reduction in Area: [1:98.70%] [2:0.00%] [N/A:N/A] % Reduction in Volume: [1:Full Thickness Without Exposed] [2:Unclassifiable] [N/A:N/A] Classification: [1:Support Structures Medium] [2:None Present] [N/A:N/A] Exudate A mount: [1:Serosanguineous] [2:N/A] [N/A:N/A] Exudate Type: [1:red, brown] [2:N/A] [N/A:N/A] Exudate Color: [1:Flat and Intact] [2:Distinct, outline attached] [N/A:N/A] Wound Margin: [1:Large (67-100%)] [2:N/A] [N/A:N/A] Granulation Amount: [1:Red] [2:N/A] [N/A:N/A] Granulation Quality: [1:None Present (0%)] [2:N/A] [N/A:N/A] Necrotic Amount: [1:N/A] [2:Eschar] [N/A:N/A] Necrotic Tissue: [1:Fat Layer (Subcutaneous Tissue): Yes Fascia: No] [N/A:N/A] Exposed Structures: [1:Fascia: No Tendon: No Muscle: No Joint: No Bone: No Large (67-100%)] [2:Fat Layer (Subcutaneous Tissue): No Tendon: No Muscle: No Joint: No Bone: No None] [N/A:N/A] Epithelialization: [1:N/A] [2:Debridement - Selective/Open Wound] [N/A:N/A] Debridement: [1:N/A] [2:14:39] [N/A:N/A] Pre-procedure Verification/Time Out Taken: [1:N/A] [2:Lidocaine] [N/A:N/A] Pain Control: [1:N/A] [2:Slough] [N/A:N/A] Tissue Debrided: [1:N/A] [2:Skin/Epidermis] [N/A:N/A] Level: [1:N/A] [2:1.4]  [N/A:N/A] Debridement A (sq cm): [1:rea N/A] [2:Curette] [N/A:N/A] Instrument: [1:N/A] [2:Minimum] [N/A:N/A] Bleeding: [1:N/A] [2:Pressure] [N/A:N/A] Hemostasis A chieved: [1:N/A] [2:0] [N/A:N/A] Procedural Pain: [1:N/A] [2:0] [N/A:N/A] Post Procedural Pain: [1:N/A] [2:Procedure was tolerated well] [N/A:N/A] Debridement Treatment Response: [1:N/A] [2:1.4x1x0.1] [N/A:N/A] Post Debridement Measurements L x W x D (cm) [1:N/A] [2:0.11] [N/A:N/A] Post Debridement Volume: (cm) [1:N/A] [2:scabbed over.] [N/A:N/A] Assessment Notes: [1:N/A] [2:Debridement] [N/A:N/A] Treatment Notes Electronic Signature(s) Signed: 03/08/2020 5:14:25 PM By: Baltazar Najjar MD Signed: 03/09/2020 5:56:51 PM By: Fonnie Mu RN Entered By: Baltazar Najjar on 03/08/2020 14:46:41 -------------------------------------------------------------------------------- Multi-Disciplinary Care Plan Details Patient Name: Date of Service: Edrick Kins, [name] 03/08/2020 2:00 PM Medical Record Number: [id number] Patient Account Number: [id number] Date of Birth/Sex: Treating RN: 1950-08-30 (70 y.o. Charlean Merl, Lauren Primary Care Jasani Lengel: PA, Candis Musa RD Other Clinician: Referring Janasha Barkalow: Treating Omarius Grantham/Extender: Baltazar Najjar PA, GUILFO RD Weeks in Treatment: 2 Active Inactive Orientation to the Wound Care Program Nursing Diagnoses: Knowledge deficit related to the wound healing center program Goals: Patient/caregiver will verbalize understanding of the Wound Healing Center Program Date Initiated: 02/23/2020 Target Resolution Date: 03/18/2020 Goal Status: Active Interventions: Provide education on orientation to the wound center Notes: Wound/Skin Impairment Nursing Diagnoses: Impaired tissue integrity Knowledge deficit related to ulceration/compromised skin integrity Goals: Patient will have a decrease in wound volume by X% from date: (specify in notes) Date Initiated: 02/23/2020 Target Resolution Date:  03/18/2020 Goal Status: Active Patient/caregiver will verbalize understanding of skin care regimen Date Initiated: 02/23/2020 Target Resolution Date: 03/18/2020 Goal Status: Active Ulcer/skin breakdown will have a volume reduction of 30% by week 4 Date Initiated: 02/23/2020 Target Resolution Date: 03/18/2020 Goal Status: Active Interventions: Assess patient/caregiver ability to obtain necessary supplies Assess patient/caregiver ability to perform ulcer/skin care regimen upon admission and as needed Assess ulceration(s) every visit Notes: Electronic Signature(s) Signed: 03/09/2020 5:56:51 PM By: Fonnie Mu RN Entered By: Fonnie Mu on 03/08/2020 14:43:40 -------------------------------------------------------------------------------- Pain Assessment Details Patient Name: Date of Service: HA NSO Steele Sizer NNEY 03/08/2020 2:00 PM Medical Record Number: [id number] Patient Account Number: [id number] Date of Birth/Sex: Treating RN: 1950-03-18 (70 y.o. Lucious Groves Primary Care Dezaray Shibuya: PA, Candis Musa RD Other Clinician: Referring Saahil Herbster: Treating Cathrine Krizan/Extender: Baltazar Najjar PA, GUILFO RD Weeks in Treatment: 2 Active Problems Location of Pain Severity and Description of Pain Patient Has Paino No Site Locations Pain Management and Medication Current Pain Management: Electronic Signature(s) Signed: 03/08/2020 3:15:52 PM By: Karl Ito Signed: 03/09/2020 5:56:51 PM By: Fonnie Mu RN Entered By: Karl Ito on 03/08/2020 14:03:33 -------------------------------------------------------------------------------- Patient/Caregiver Education Details Patient Name: Date of Service: [name] 1/25/2022andnbsp2:00 PM Medical Record Number: [id number] Patient Account Number: [id number] Date  of Birth/Gender: Treating RN: [birth year] (70 y.o. Lucious Groves Primary Care Physician: PA, Candis Musa RD Other Clinician: Referring Physician: Treating  Physician/Extender: Jeneen Montgomery RD Weeks in Treatment: 2 Education Assessment Education Provided To: Patient Education Topics Provided Welcome T [facility]: o Handouts: Welcome T [facility] o Methods: Explain/Verbal Responses: State content correctly Electronic Signature(s) Signed: 03/09/2020 5:56:51 PM By: Fonnie Mu RN Entered By: Fonnie Mu on 03/08/2020 14:43:58 -------------------------------------------------------------------------------- Wound Assessment Details Patient Name: Date of Service: HA NSO Steele Sizer NNEY 03/08/2020 2:00 PM Medical Record Number: [id number] Patient Account Number: [id number] Date of Birth/Sex: Treating RN: 29-Apr-1950 (70 y.o. Tammy Sours Primary Care Kerrion Kemppainen: PA, GUILFO RD Other Clinician: Referring Jackilyn Umphlett: Treating Jamez Ambrocio/Extender: Baltazar Najjar PA, GUILFO RD Weeks in Treatment: 2 Wound Status Wound Number: 1 Primary Etiology: Abrasion Wound Location: Right Knee Wound Status: Open Wounding Event: Trauma Comorbid Cataracts, Glaucoma, Hypertension, Osteoarthritis, History: Dementia Date Acquired: 01/12/2020 Weeks Of Treatment: 2 Clustered Wound: No Wound Measurements Length: (cm) 0.2 Width: (cm) 0.2 Depth: (cm) 0.1 Area: (cm) 0.031 Volume: (cm) 0.003 % Reduction in Area: 98.6% % Reduction in Volume: 98.7% Epithelialization: Large (67-100%) Tunneling: No Undermining: No Wound Description Classification: Full Thickness Without Exposed Support Structures Wound Margin: Flat and Intact Exudate Amount: Medium Exudate Type: Serosanguineous Exudate Color: red, brown Foul Odor After Cleansing: No Slough/Fibrino No Wound Bed Granulation Amount: Large (67-100%) Exposed Structure Granulation Quality: Red Fascia Exposed: No Necrotic Amount: None Present (0%) Fat Layer (Subcutaneous Tissue) Exposed: Yes Tendon Exposed: No Muscle Exposed: No Joint Exposed: No Bone Exposed:  No Treatment Notes Wound #1 (Knee) Wound Laterality: Right Cleanser Normal Saline Discharge Instruction: Cleanse the wound with Normal Saline prior to applying a clean dressing using gauze sponges, not tissue or cotton balls. Peri-Wound Care Skin Prep Discharge Instruction: Use skin prep as directed Topical Primary Dressing Fibracol Plus Dressing, 4x4.38 in (collagen) Discharge Instruction: Moisten collagen with saline or hydrogel. May also use KY jelly. Secondary Dressing ComfortFoam Border, 3x3 in (silicone border) Discharge Instruction: Apply over primary dressing as directed. Secured With [employer], 4.5x3.1 (in/yd) Discharge Instruction: Secure with Kerlix as directed. 55M Medipore H Soft Cloth Surgical T ape, 2x2 (in/yd) Discharge Instruction: Secure dressing with tape as directed. Compression Wrap Compression Stockings Add-Ons Electronic Signature(s) Signed: 03/08/2020 6:32:06 PM By: Shawn Stall Entered By: Shawn Stall on 03/08/2020 14:18:21 -------------------------------------------------------------------------------- Wound Assessment Details Patient Name: Date of Service: [name] 03/08/2020 2:00 PM Medical Record Number: [id number] Patient Account Number: [id number] Date of Birth/Sex: Treating RN: 01/27/1951 (70 y.o. Charlean Merl, Lauren Primary Care Myrtle Haller: PA, GUILFO RD Other Clinician: Referring Zaquan Duffner: Treating Marte Celani/Extender: Baltazar Najjar PA, GUILFO RD Weeks in Treatment: 2 Wound Status Wound Number: 2 Primary Etiology: Abrasion Wound Location: Right, Lateral Knee Wound Status: Open Wounding Event: Gradually Appeared Comorbid Cataracts, Glaucoma, Hypertension, Osteoarthritis, History: Dementia Date Acquired: 03/08/2020 Weeks Of Treatment: 0 Clustered Wound: No Wound Measurements Length: (cm) 1.4 Width: (cm) 1 Depth: (cm) 0.1 Area: (cm) 1.1 Volume: (cm) 0.11 % Reduction in Area: 0% % Reduction in Volume:  0% Epithelialization: None Tunneling: No Undermining: No Wound Description Classification: Unclassifiable Wound Margin: Distinct, outline attached Exudate Amount: None Present Foul Odor After Cleansing: No Slough/Fibrino No Wound Bed Necrotic Amount: Large (67-100%) Exposed Structure Necrotic Quality: Eschar Fascia Exposed: No Fat Layer (Subcutaneous Tissue) Exposed: No Tendon Exposed: No Muscle Exposed: No Joint Exposed: No Bone Exposed: No Assessment Notes scabbed over. Treatment Notes Wound #2 (Knee) Wound Laterality:  Right, Lateral Cleanser Normal Saline Discharge Instruction: Cleanse the wound with Normal Saline prior to applying a clean dressing using gauze sponges, not tissue or cotton balls. Peri-Wound Care Skin Prep Discharge Instruction: Use skin prep as directed Topical Primary Dressing Fibracol Plus Dressing, 4x4.38 in (collagen) Discharge Instruction: Moisten collagen with saline or hydrogel. May also use KY jelly. Secondary Dressing ComfortFoam Border, 3x3 in (silicone border) Discharge Instruction: Apply over primary dressing as directed. Secured With [employer], 4.5x3.1 (in/yd) Discharge Instruction: Secure with Kerlix as directed. 58M Medipore H Soft Cloth Surgical T ape, 2x2 (in/yd) Discharge Instruction: Secure dressing with tape as directed. Compression Wrap Compression Stockings Add-Ons Electronic Signature(s) Signed: 03/08/2020 6:32:06 PM By: Shawn Stall Signed: 03/09/2020 5:56:51 PM By: Fonnie Mu RN Entered By: Shawn Stall on 03/08/2020 14:19:01 -------------------------------------------------------------------------------- Vitals Details Patient Name: Date of Service: HA NSO Dorris Carnes, [name] 03/08/2020 2:00 PM Medical Record Number: [id number] Patient Account Number: [id number] Date of Birth/Sex: Treating RN: 03-Feb-1951 (70 y.o. Charlean Merl, Lauren Primary Care Libra Gatz: PA, GUILFO RD Other Clinician: Referring  Jemarcus Dougal: Treating Vonya Ohalloran/Extender: Baltazar Najjar PA, GUILFO RD Weeks in Treatment: 2 Vital Signs Time Taken: 14:03 Temperature (F): 98.4 Height (in): 62 Pulse (bpm): 73 Weight (lbs): 155 Respiratory Rate (breaths/min): 18 Body Mass Index (BMI): 28.3 Blood Pressure (mmHg): 114/66 Reference Range: 80 - 120 mg / dl Electronic Signature(s) Signed: 03/08/2020 3:15:52 PM By: Karl Ito Signed: 03/08/2020 3:15:52 PM By: Karl Ito Entered By: Karl Ito on 03/08/2020 14:03:25

## 2020-03-10 DIAGNOSIS — R531 Weakness: Secondary | ICD-10-CM | POA: Diagnosis not present

## 2020-03-10 DIAGNOSIS — R2681 Unsteadiness on feet: Secondary | ICD-10-CM | POA: Diagnosis not present

## 2020-03-10 DIAGNOSIS — R269 Unspecified abnormalities of gait and mobility: Secondary | ICD-10-CM | POA: Diagnosis not present

## 2020-03-10 DIAGNOSIS — F79 Unspecified intellectual disabilities: Secondary | ICD-10-CM | POA: Diagnosis not present

## 2020-03-15 DIAGNOSIS — F79 Unspecified intellectual disabilities: Secondary | ICD-10-CM | POA: Diagnosis not present

## 2020-03-15 DIAGNOSIS — R2681 Unsteadiness on feet: Secondary | ICD-10-CM | POA: Diagnosis not present

## 2020-03-15 DIAGNOSIS — R269 Unspecified abnormalities of gait and mobility: Secondary | ICD-10-CM | POA: Diagnosis not present

## 2020-03-15 DIAGNOSIS — R531 Weakness: Secondary | ICD-10-CM | POA: Diagnosis not present

## 2020-03-17 DIAGNOSIS — R972 Elevated prostate specific antigen [PSA]: Secondary | ICD-10-CM | POA: Diagnosis not present

## 2020-03-17 DIAGNOSIS — R300 Dysuria: Secondary | ICD-10-CM | POA: Diagnosis not present

## 2020-03-17 DIAGNOSIS — N411 Chronic prostatitis: Secondary | ICD-10-CM | POA: Diagnosis not present

## 2020-03-22 DIAGNOSIS — R531 Weakness: Secondary | ICD-10-CM | POA: Diagnosis not present

## 2020-03-22 DIAGNOSIS — F79 Unspecified intellectual disabilities: Secondary | ICD-10-CM | POA: Diagnosis not present

## 2020-03-22 DIAGNOSIS — R2681 Unsteadiness on feet: Secondary | ICD-10-CM | POA: Diagnosis not present

## 2020-03-22 DIAGNOSIS — R269 Unspecified abnormalities of gait and mobility: Secondary | ICD-10-CM | POA: Diagnosis not present

## 2020-03-24 DIAGNOSIS — R269 Unspecified abnormalities of gait and mobility: Secondary | ICD-10-CM | POA: Diagnosis not present

## 2020-03-24 DIAGNOSIS — R531 Weakness: Secondary | ICD-10-CM | POA: Diagnosis not present

## 2020-03-24 DIAGNOSIS — R2681 Unsteadiness on feet: Secondary | ICD-10-CM | POA: Diagnosis not present

## 2020-03-24 DIAGNOSIS — F79 Unspecified intellectual disabilities: Secondary | ICD-10-CM | POA: Diagnosis not present

## 2020-03-29 ENCOUNTER — Encounter (HOSPITAL_BASED_OUTPATIENT_CLINIC_OR_DEPARTMENT_OTHER): Payer: Medicare Other | Attending: Internal Medicine | Admitting: Internal Medicine

## 2020-03-29 ENCOUNTER — Other Ambulatory Visit: Payer: Self-pay

## 2020-03-29 DIAGNOSIS — L98498 Non-pressure chronic ulcer of skin of other sites with other specified severity: Secondary | ICD-10-CM | POA: Insufficient documentation

## 2020-03-29 DIAGNOSIS — Z8673 Personal history of transient ischemic attack (TIA), and cerebral infarction without residual deficits: Secondary | ICD-10-CM | POA: Insufficient documentation

## 2020-03-29 DIAGNOSIS — Z993 Dependence on wheelchair: Secondary | ICD-10-CM | POA: Insufficient documentation

## 2020-03-29 DIAGNOSIS — N400 Enlarged prostate without lower urinary tract symptoms: Secondary | ICD-10-CM | POA: Diagnosis not present

## 2020-03-31 DIAGNOSIS — F79 Unspecified intellectual disabilities: Secondary | ICD-10-CM | POA: Diagnosis not present

## 2020-03-31 DIAGNOSIS — R531 Weakness: Secondary | ICD-10-CM | POA: Diagnosis not present

## 2020-03-31 DIAGNOSIS — R269 Unspecified abnormalities of gait and mobility: Secondary | ICD-10-CM | POA: Diagnosis not present

## 2020-03-31 DIAGNOSIS — R2681 Unsteadiness on feet: Secondary | ICD-10-CM | POA: Diagnosis not present

## 2020-04-01 NOTE — Progress Notes (Signed)
[name], Ismael ([id number]) Visit Report for 03/29/2020 Arrival Information Details Patient Name: Date of Service: Darcella GasmanHA NSO N, [name] 03/29/2020 2:00 PM Medical Record Number: [id number] Patient Account Number: [id number] Date of Birth/Sex: Treating RN: 12/09/1950 (70 y.o. Charlean MerlM) Breedlove, Lauren Primary Care Maryetta Shafer: PA, GUILFO RD Other Clinician: Referring Vaiden Adames: Treating Arissa Fagin/Extender: Baltazar Najjarobson, Michael PA, GUILFO RD Weeks in Treatment: 5 Visit Information History Since Last Visit Added or deleted any medications: No Patient Arrived: Wheel Chair Any new allergies or adverse reactions: No Arrival Time: 14:20 Had a fall or experienced change in No Accompanied By: brother activities of daily living that may affect Transfer Assistance: None risk of falls: Patient Identification Verified: Yes Signs or symptoms of abuse/neglect since last visito No Secondary Verification Process Completed: Yes Hospitalized since last visit: No Patient Requires Transmission-Based Precautions: No Implantable device outside of the clinic excluding No Patient Has Alerts: No cellular tissue based products placed in the center since last visit: Pain Present Now: No Electronic Signature(s) Signed: 03/29/2020 3:53:01 PM By: Benjaman KindlerJones, Dedrick EMT/HBOT/SD Entered By: Benjaman KindlerJones, Dedrick on 03/29/2020 14:31:53 -------------------------------------------------------------------------------- Clinic Level of Care Assessment Details Patient Name: Date of Service: HA NSO Steele Sizer, [name] 03/29/2020 2:00 PM Medical Record Number: [id number] Patient Account Number: [id number] Date of Birth/Sex: Treating RN: 08/27/1950 (70 y.o. Charlean MerlM) Breedlove, Lauren Primary Care Renuka Farfan: PA, GUILFO RD Other Clinician: Referring Amaro Mangold: Treating Bri Wakeman/Extender: Baltazar Najjarobson, Michael PA, GUILFO RD Weeks in Treatment: 5 Clinic Level of Care Assessment Items TOOL 4 Quantity Score X- 1 0 Use when only an EandM is performed on FOLLOW-UP  visit ASSESSMENTS - Nursing Assessment / Reassessment X- 1 10 Reassessment of Co-morbidities (includes updates in patient status) X- 1 5 Reassessment of Adherence to Treatment Plan ASSESSMENTS - Wound and Skin A ssessment / Reassessment X - Simple Wound Assessment / Reassessment - one wound 1 5 []  - 0 Complex Wound Assessment / Reassessment - multiple wounds X- 1 10 Dermatologic / Skin Assessment (not related to wound area) ASSESSMENTS - Focused Assessment X- 1 5 Circumferential Edema Measurements - multi extremities []  - 0 Nutritional Assessment / Counseling / Intervention X- 1 5 Lower Extremity Assessment (monofilament, tuning fork, pulses) []  - 0 Peripheral Arterial Disease Assessment (using hand held doppler) ASSESSMENTS - Ostomy and/or Continence Assessment and Care []  - 0 Incontinence Assessment and Management []  - 0 Ostomy Care Assessment and Management (repouching, etc.) PROCESS - Coordination of Care X - Simple Patient / Family Education for ongoing care 1 15 []  - 0 Complex (extensive) Patient / Family Education for ongoing care X- 1 10 Staff obtains ChiropractorConsents, Records, T Results / Process Orders est X- 1 10 Staff telephones HHA, Nursing Homes / Clarify orders / etc []  - 0 Routine Transfer to another Facility (non-emergent condition) []  - 0 Routine Hospital Admission (non-emergent condition) []  - 0 New Admissions / Manufacturing engineernsurance Authorizations / Ordering NPWT Apligraf, etc. , []  - 0 Emergency Hospital Admission (emergent condition) X- 1 10 Simple Discharge Coordination []  - 0 Complex (extensive) Discharge Coordination PROCESS - Special Needs []  - 0 Pediatric / Minor Patient Management []  - 0 Isolation Patient Management []  - 0 Hearing / Language / Visual special needs []  - 0 Assessment of Community assistance (transportation, D/C planning, etc.) []  - 0 Additional assistance / Altered mentation []  - 0 Support Surface(s) Assessment (bed, cushion, seat,  etc.) INTERVENTIONS - Wound Cleansing / Measurement X - Simple Wound Cleansing - one wound 1 5 []  - 0 Complex Wound Cleansing - multiple wounds X- 1  5 Wound Imaging (photographs - any number of wounds) []  - 0 Wound Tracing (instead of photographs) X- 1 5 Simple Wound Measurement - one wound []  - 0 Complex Wound Measurement - multiple wounds INTERVENTIONS - Wound Dressings []  - 0 Small Wound Dressing one or multiple wounds []  - 0 Medium Wound Dressing one or multiple wounds []  - 0 Large Wound Dressing one or multiple wounds []  - 0 Application of Medications - topical []  - 0 Application of Medications - injection INTERVENTIONS - Miscellaneous []  - 0 External ear exam []  - 0 Specimen Collection (cultures, biopsies, blood, body fluids, etc.) []  - 0 Specimen(s) / Culture(s) sent or taken to Lab for analysis []  - 0 Patient Transfer (multiple staff / / Similar devices) []  - 0 Simple Staple / Suture removal (25 or less) []  - 0 Complex Staple / Suture removal (26 or more) []  - 0 Hypo / Hyperglycemic Management (close monitor of Blood Glucose) []  - 0 Ankle / Brachial Index (ABI) - do not check if billed separately X- 1 5 Vital Signs Has the patient been seen at the hospital within the last three years: Yes Total Score: 105 Level Of Care: New/Established - Level 3 Electronic Signature(s) Signed: 04/01/2020 5:09:47 PM By: RN Entered By: on 03/29/2020 15:20:01 -------------------------------------------------------------------------------- Lower Extremity Assessment Details Patient Name: Date of Service: HA NSO NNEY 03/29/2020 2:00 PM Medical Record Number: Patient Account Number: Date of Birth/Sex: Treating RN: 1950/04/27 (70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, adult Primary Care Ranette Luckadoo: PA, RD Other Clinician: Referring Keilah Lemire: Treating Birttany Dechellis/Extender: PA, GUILFO RD Weeks in Treatment:  5 Electronic Signature(s) Signed: 03/29/2020 3:53:01 PM By: EMT/HBOT/SD Signed: 04/01/2020 5:09:47 PM By: Fonnie Mu RN Entered By: Fonnie Mu on 03/29/2020 14:32:33 -------------------------------------------------------------------------------- Multi Wound Chart Details Patient Name: Date of Service: HA NSO Steele Sizer, [name] 03/29/2020 2:00 PM Medical Record Number: [id number] Patient Account Number: [id number] Date of Birth/Sex: Treating RN: 23-May-1950 (70 y.o. Lucious Groves, Lauren Primary Care Ina Scrivens: PA, GUILFO RD Other Clinician: Referring Aashna Matson: Treating Nori Poland/Extender: Candis Musa PA, GUILFO RD Weeks in Treatment: 5 Vital Signs Height(in): 62 Pulse(bpm): 80 Weight(lbs): 155 Blood Pressure(mmHg): 123/71 Body Mass Index(BMI): 28 Temperature(F): 98.5 Respiratory Rate(breaths/min): 15 Photos: [1:No Photos Right Knee] [2:No Photos Right, Lateral Knee] [N/A:N/A N/A] Wound Location: [1:Trauma] [2:Gradually Appeared] [N/A:N/A] Wounding Event: [1:Abrasion] [2:Abrasion] [N/A:N/A] Primary Etiology: [1:Cataracts, Glaucoma, Hypertension,] [2:Cataracts, Glaucoma, Hypertension,] [N/A:N/A] Comorbid History: [1:Osteoarthritis, Dementia 01/12/2020] [2:Osteoarthritis, Dementia 03/08/2020] [N/A:N/A] Date Acquired: [1:5] [2:3] [N/A:N/A] Weeks of Treatment: [1:Healed - Epithelialized] [2:Healed - Epithelialized] [N/A:N/A] Wound Status: [1:0x0x0] [2:0x0x0] [N/A:N/A] Measurements L x W x D (cm) [1:0] [2:0] [N/A:N/A] A (cm) : rea [1:0] [2:0] [N/A:N/A] Volume (cm) : [1:100.00%] [2:100.00%] [N/A:N/A] % Reduction in Area: [1:100.00%] [2:100.00%] [N/A:N/A] % Reduction in Volume: [1:Full Thickness Without Exposed] [2:Partial Thickness] [N/A:N/A] Classification: [1:Support Structures None Present] [2:None Present] [N/A:N/A] Exudate Amount: [1:None Present (0%)] [2:None Present (0%)] [N/A:N/A] Granulation Amount: [1:None Present (0%)] [2:None Present (0%)]  [N/A:N/A] Necrotic Amount: [1:Fascia: No] [2:Fascia: No] [N/A:N/A] Exposed Structures: [1:Fat Layer (Subcutaneous Tissue): No Tendon: No Muscle: No Joint: No Bone: No Large (67-100%)] [2:Fat Layer (Subcutaneous Tissue): No Tendon: No Muscle: No Joint: No Bone: No Large (67-100%)] [N/A:N/A] Treatment Notes Electronic Signature(s) Signed: 03/29/2020 5:22:09 PM By: 04/03/2020 MD Signed: 04/01/2020 5:09:47 PM By: Benjaman Kindler RN Entered By: 03/31/2020 on 03/29/2020 17:16:10 -------------------------------------------------------------------------------- Multi-Disciplinary Care Plan Details Patient Name: Date of Service: HA NSO N, [name] 03/29/2020  2:00 PM Medical Record Number: [id number] Patient Account Number: [id number] Date of Birth/Sex: Treating RN: Feb 06, 1951 (70 y.o. Lucious Groves Primary Care Jaylissa Felty: PA, Candis Musa RD Other Clinician: Referring Juley Giovanetti: Treating Juandaniel Manfredo/Extender: Baltazar Najjar PA, GUILFO RD Weeks in Treatment: 5 Active Inactive Electronic Signature(s) Signed: 04/01/2020 5:09:47 PM By: Fonnie Mu RN Entered By: Fonnie Mu on 03/29/2020 15:16:11 -------------------------------------------------------------------------------- Pain Assessment Details Patient Name: Date of Service: HA NSO Dorris Carnes, [name] 03/29/2020 2:00 PM Medical Record Number: [id number] Patient Account Number: [id number] Date of Birth/Sex: Treating RN: 27-Sep-1950 (70 y.o. Lucious Groves Primary Care Chijioke Lasser: PA, Candis Musa RD Other Clinician: Referring Kayann Maj: Treating Rush Salce/Extender: Baltazar Najjar PA, GUILFO RD Weeks in Treatment: 5 Active Problems Location of Pain Severity and Description of Pain Patient Has Paino No Site Locations With Dressing Change: No Pain Management and Medication Current Pain Management: Electronic Signature(s) Signed: 03/29/2020 3:53:01 PM By: Benjaman Kindler EMT/HBOT/SD Signed: 04/01/2020 5:09:47 PM By: Fonnie Mu  RN Entered By: Benjaman Kindler on 03/29/2020 14:32:24 -------------------------------------------------------------------------------- Patient/Caregiver Education Details Patient Name: Date of Service: [name] 2/15/2022andnbsp2:00 PM Medical Record Number: [id number] Patient Account Number: [id number] Date of Birth/Gender: Treating RN: [birth year] (70 y.o. Lucious Groves Primary Care Physician: PA, Candis Musa RD Other Clinician: Referring Physician: Treating Physician/Extender: Jeneen Montgomery RD Weeks in Treatment: 5 Education Assessment Education Provided To: Patient Education Topics Provided Wound/Skin Impairment: Methods: Explain/Verbal Responses: State content correctly Electronic Signature(s) Signed: 04/01/2020 5:09:47 PM By: Fonnie Mu RN Entered By: Fonnie Mu on 03/29/2020 15:17:35 -------------------------------------------------------------------------------- Wound Assessment Details Patient Name: Date of Service: HA NSO Steele Sizer NNEY 03/29/2020 2:00 PM Medical Record Number: [id number] Patient Account Number: [id number] Date of Birth/Sex: Treating RN: May 10, 1950 (70 y.o. Charlean Merl, Lauren Primary Care Tamasha Laplante: PA, GUILFO RD Other Clinician: Referring Katieann Hungate: Treating Salimata Christenson/Extender: Baltazar Najjar PA, GUILFO RD Weeks in Treatment: 5 Wound Status Wound Number: 1 Primary Etiology: Abrasion Wound Location: Right Knee Wound Status: Healed - Epithelialized Wounding Event: Trauma Comorbid Cataracts, Glaucoma, Hypertension, Osteoarthritis, History: Dementia Date Acquired: 01/12/2020 Weeks Of Treatment: 5 Clustered Wound: No Photos Photo Uploaded By: Benjaman Kindler on 03/30/2020 14:36:22 Wound Measurements Length: (cm) Width: (cm) Depth: (cm) Area: (cm) Volume: (cm) 0 % Reduction in Area: 100% 0 % Reduction in Volume: 100% 0 Epithelialization: Large (67-100%) 0 Tunneling: No 0 Undermining: No Wound  Description Classification: Full Thickness Without Exposed Support Structures Exudate Amount: None Present Foul Odor After Cleansing: No Slough/Fibrino No Wound Bed Granulation Amount: None Present (0%) Exposed Structure Necrotic Amount: None Present (0%) Fascia Exposed: No Fat Layer (Subcutaneous Tissue) Exposed: No Tendon Exposed: No Muscle Exposed: No Joint Exposed: No Bone Exposed: No Electronic Signature(s) Signed: 03/29/2020 5:45:50 PM By: Zandra Abts RN, BSN Signed: 04/01/2020 5:09:47 PM By: Fonnie Mu RN Entered By: Zandra Abts on 03/29/2020 14:51:54 -------------------------------------------------------------------------------- Wound Assessment Details Patient Name: Date of Service: HA NSO N, [name] 03/29/2020 2:00 PM Medical Record Number: [id number] Patient Account Number: [id number] Date of Birth/Sex: Treating RN: 07-Apr-1950 (70 y.o. Lucious Groves Primary Care Marvon Shillingburg: PA, Candis Musa RD Other Clinician: Referring Timberlee Roblero: Treating Tyrion Glaude/Extender: Baltazar Najjar PA, GUILFO RD Weeks in Treatment: 5 Wound Status Wound Number: 2 Primary Etiology: Abrasion Wound Location: Right, Lateral Knee Wound Status: Healed - Epithelialized Wounding Event: Gradually Appeared Comorbid Cataracts, Glaucoma, Hypertension, Osteoarthritis, History: Dementia Date Acquired: 03/08/2020 Weeks Of Treatment: 3 Clustered Wound: No Photos Photo Uploaded By: Benjaman Kindler on 03/30/2020 14:36:22 Wound Measurements Length: (cm) Width: (cm) Depth: (cm) Area: (cm) Volume: (cm) 0 % Reduction  in Area: 100% 0 % Reduction in Volume: 100% 0 Epithelialization: Large (67-100%) 0 Tunneling: No 0 Undermining: No Wound Description Classification: Partial Thickness Exudate Amount: None Present Foul Odor After Cleansing: No Slough/Fibrino No Wound Bed Granulation Amount: None Present (0%) Exposed Structure Necrotic Amount: None Present (0%) Fascia Exposed: No Fat  Layer (Subcutaneous Tissue) Exposed: No Tendon Exposed: No Muscle Exposed: No Joint Exposed: No Bone Exposed: No Electronic Signature(s) Signed: 03/29/2020 5:45:50 PM By: Zandra Abts RN, BSN Signed: 04/01/2020 5:09:47 PM By: Fonnie Mu RN Entered By: Zandra Abts on 03/29/2020 14:52:15 -------------------------------------------------------------------------------- Vitals Details Patient Name: Date of Service: HA NSO N, [name] 03/29/2020 2:00 PM Medical Record Number: [id number] Patient Account Number: [id number] Date of Birth/Sex: Treating RN: September 04, 1950 (70 y.o. Charlean Merl, Lauren Primary Care Rudi Bunyard: PA, GUILFO RD Other Clinician: Referring Arienne Gartin: Treating Elainah Rhyne/Extender: Baltazar Najjar PA, GUILFO RD Weeks in Treatment: 5 Vital Signs Time Taken: 14:25 Temperature (F): 98.5 Height (in): 62 Pulse (bpm): 80 Weight (lbs): 155 Respiratory Rate (breaths/min): 15 Body Mass Index (BMI): 28.3 Blood Pressure (mmHg): 123/71 Reference Range: 80 - 120 mg / dl Electronic Signature(s) Signed: 03/29/2020 3:53:01 PM By: Benjaman Kindler EMT/HBOT/SD Entered By: Benjaman Kindler on 03/29/2020 14:32:16

## 2020-04-01 NOTE — Progress Notes (Signed)
TYLYN, DERWIN ([id number]) Visit Report for 03/29/2020 HPI Details Patient Name: Date of Service: [name] 03/29/2020 2:00 PM Medical Record Number: [id number] Patient Account Number: [id number] Date of Birth/Sex: Treating RN: May 24, 1950 (70 y.o. Lucious Groves Primary Care Provider: PA, Candis Musa RD Other Clinician: Referring Provider: Treating Provider/Extender: Baltazar Najjar PA, GUILFO RD Weeks in Treatment: 5 History of Present Illness HPI Description: ADMISSION 02/23/2020 This is a 70 year old man who is a resident of [address] assisted living. He is blind and hard of hearing. Very disabled I think because of myelopathy. In fact he is wheelchair-bound and spends only time in wheelchair or in bed. Apparently his problems started at the end of November he fell out of bed and developed a wound on the right medial knee. This healed fairly quickly however he had another fall and the area again was reopened caused by friction on the carpet. Again this wound was dressed at the facility and again healed up only to open up again within the last week and a half. In fact he saw his primary doctor last week and there was no open wound in place. His situation is complicated by the fact that he is going to outpatient rehab therefore he cannot have home health in the facility based on the lack of insurance coverage. The patient's brother who accompanied him said that there is a LPN and a nurse at [facility] however not totally sure whether they will change the dressing or not. I believe they are using Xeroform and foam but I am not completely sure who is changing it. The brother says he has not had a dressing on in a week Past medical history includes spondylosis with myelopathy apparently at the thoracic and lumbar area, glaucoma cyst causing blindness, major depression, hard of hearing, hypertension, BPH, history of a CVA. He lives at [facility]. Prior to this he lives  with a brother who accompanied him today. 1/25; the patient came in with an abrasion on his medial right knee 2 weeks ago. We have been using silver collagen and foam changing every second day apparently the dressing is being changed by the nurse at [facility] rather than home health. Comes in today with a new abrasion on the right lateral knee presumably trauma although his brother is not aware of exactly when and how that occurred 2/15; the patient comes in with the areas on his right knee healed. These were abrasion from falls we had them on the medial and lateral aspect the knee they are totally epithelialized. By talking with his brother he continues to have falls however currently no open wounds Electronic Signature(s) Signed: 03/29/2020 5:22:09 PM By: Baltazar Najjar MD Entered By: Baltazar Najjar on 03/29/2020 17:16:44 -------------------------------------------------------------------------------- Physical Exam Details Patient Name: Date of Service: HA NSO N, [name] 03/29/2020 2:00 PM Medical Record Number: [id number] Patient Account Number: [id number] Date of Birth/Sex: Treating RN: 1950/04/18 (70 y.o. Lucious Groves Primary Care Provider: PA, Candis Musa RD Other Clinician: Referring Provider: Treating Provider/Extender: Baltazar Najjar PA, GUILFO RD Weeks in Treatment: 5 Constitutional Sitting or standing Blood Pressure is within target range for patient.. Pulse regular and within target range for patient.Marland Kitchen Respirations regular, non-labored and within target range.. Temperature is normal and within the target range for the patient.Marland Kitchen Appears in no distress. Notes Wound exam; patient's wound area on the right medial knee and the new area from last time are both fully epithelialized. There is no evidence of infection no palpable  effusion in the knee Electronic Signature(s) Signed: 03/29/2020 5:22:09 PM By: Baltazar Najjar MD Entered By: Baltazar Najjar on 03/29/2020  17:17:47 -------------------------------------------------------------------------------- Physician Orders Details Patient Name: Date of Service: HA NSO N, [name] 03/29/2020 2:00 PM Medical Record Number: [id number] Patient Account Number: [id number] Date of Birth/Sex: Treating RN: 1950/08/16 (70 y.o. Lucious Groves Primary Care Provider: PA, Candis Musa RD Other Clinician: Referring Provider: Treating Provider/Extender: Baltazar Najjar PA, GUILFO RD Weeks in Treatment: 5 Verbal / Phone Orders: No Diagnosis Coding Follow-up Appointments Other: - No need to follow up! Call us if you have any future concerns!!:) Discharge From [facility] Services Discharge from Wound Care Center Electronic Signature(s) Signed: 03/29/2020 5:22:09 PM By: Baltazar Najjar MD Signed: 04/01/2020 5:09:47 PM By: Fonnie Mu RN Entered By: Fonnie Mu on 03/29/2020 15:14:28 -------------------------------------------------------------------------------- Problem List Details Patient Name: Date of Service: HA NSO N, [name] 03/29/2020 2:00 PM Medical Record Number: [id number] Patient Account Number: [id number] Date of Birth/Sex: Treating RN: [birth year] (70 y.o. Lucious Groves Primary Care Provider: PA, Candis Musa RD Other Clinician: Referring Provider: Treating Provider/Extender: Baltazar Najjar PA, GUILFO RD Weeks in Treatment: 5 Active Problems ICD-10 Encounter Code Description Active Date MDM Diagnosis S80.211D Abrasion, right knee, subsequent encounter 02/23/2020 No Yes L98.498 Non-pressure chronic ulcer of skin of other sites with other specified severity 02/23/2020 No Yes Inactive Problems Resolved Problems Electronic Signature(s) Signed: 03/29/2020 5:22:09 PM By: Baltazar Najjar MD Entered By: Baltazar Najjar on 03/29/2020 17:16:06 -------------------------------------------------------------------------------- Progress Note Details Patient Name: Date of Service: HA NSO Dorris Carnes, [name]  03/29/2020 2:00 PM Medical Record Number: [id number] Patient Account Number: [id number] Date of Birth/Sex: Treating RN: 1950/03/25 (70 y.o. Lucious Groves Primary Care Provider: PA, Candis Musa RD Other Clinician: Referring Provider: Treating Provider/Extender: Baltazar Najjar PA, GUILFO RD Weeks in Treatment: 5 Subjective History of Present Illness (HPI) ADMISSION 02/23/2020 This is a 70 year old man who is a resident of [address] assisted [facility]. He is blind and hard of hearing. Very disabled I think because of myelopathy. In fact he is wheelchair-bound and spends only time in wheelchair or in bed. Apparently his problems started at the end of November he fell out of bed and developed a wound on the right medial knee. This healed fairly quickly however he had another fall and the area again was reopened caused by friction on the carpet. Again this wound was dressed at the facility and again healed up only to open up again within the last week and a half. In fact he saw his primary doctor last week and there was no open wound in place. His situation is complicated by the fact that he is going to outpatient rehab therefore he cannot have home health in the facility based on the lack of insurance coverage. The patient's brother who accompanied him said that there is a LPN and a nurse at [facility] however not totally sure whether they will change the dressing or not. I believe they are using Xeroform and foam but I am not completely sure who is changing it. The brother says he has not had a dressing on in a week Past medical history includes spondylosis with myelopathy apparently at the thoracic and lumbar area, glaucoma cyst causing blindness, major depression, hard of hearing, hypertension, BPH, history of a CVA. He lives at [facility]. Prior to this he lives with a brother who accompanied him today. 1/25; the patient came in with an abrasion on his medial right knee 2 weeks  ago. We have been  using silver collagen and foam changing every second day apparently the dressing is being changed by the nurse at [facility] rather than home health. Comes in today with a new abrasion on the right lateral knee presumably trauma although his brother is not aware of exactly when and how that occurred 2/15; the patient comes in with the areas on his right knee healed. These were abrasion from falls we had them on the medial and lateral aspect the knee they are totally epithelialized. By talking with his brother he continues to have falls however currently no open wounds Objective Constitutional Sitting or standing Blood Pressure is within target range for patient.. Pulse regular and within target range for patient.Marland Kitchen Respirations regular, non-labored and within target range.. Temperature is normal and within the target range for the patient.Marland Kitchen Appears in no distress. Vitals Time Taken: 2:25 PM, Height: 62 in, Weight: 155 lbs, BMI: 28.3, Temperature: 98.5 F, Pulse: 80 bpm, Respiratory Rate: 15 breaths/min, Blood Pressure: 123/71 mmHg. General Notes: Wound exam; patient's wound area on the right medial knee and the new area from last time are both fully epithelialized. There is no evidence of infection no palpable effusion in the knee Integumentary (Hair, Skin) Wound #1 status is Healed - Epithelialized. Original cause of wound was Trauma. The wound is located on the Right Knee. The wound measures 0cm length x 0cm width x 0cm depth; 0cm^2 area and 0cm^3 volume. There is no tunneling or undermining noted. There is a none present amount of drainage noted. There is no granulation within the wound bed. There is no necrotic tissue within the wound bed. Wound #2 status is Healed - Epithelialized. Original cause of wound was Gradually Appeared. The wound is located on the Right,Lateral Knee. The wound measures 0cm length x 0cm width x 0cm depth; 0cm^2 area and 0cm^3 volume. There is no  tunneling or undermining noted. There is a none present amount of drainage noted. There is no granulation within the wound bed. There is no necrotic tissue within the wound bed. Assessment Active Problems ICD-10 Abrasion, right knee, subsequent encounter Non-pressure chronic ulcer of skin of other sites with other specified severity Plan Follow-up Appointments: Other: - No need to follow up! Call us if you have any future concerns!!:) Discharge From [facility] Services: Discharge from Wound Care Center 1. The patient can be discharged from the wound care center he has no open wound #2 the wounds are abrasions from fall issues Electronic Signature(s) Signed: 03/29/2020 5:22:09 PM By: Baltazar Najjar MD Entered By: Baltazar Najjar on 03/29/2020 17:18:20 -------------------------------------------------------------------------------- SuperBill Details Patient Name: Date of Service: Edrick Kins, [name] 03/29/2020 Medical Record Number: [id number] Patient Account Number: [id number] Date of Birth/Sex: Treating RN: 10-Feb-1951 (70 y.o. Lucious Groves Primary Care Provider: PA, Candis Musa RD Other Clinician: Referring Provider: Treating Provider/Extender: Baltazar Najjar PA, GUILFO RD Weeks in Treatment: 5 Diagnosis Coding ICD-10 Codes Code Description S80.211D Abrasion, right knee, subsequent encounter L98.498 Non-pressure chronic ulcer of skin of other sites with other specified severity Facility Procedures CPT4 Code: [id number] Description: 99213 - WOUND CARE VISIT-LEV 3 EST PT Modifier: Quantity: 1 Physician Procedures : CPT4 Code Description Modifier [id number] [phone] - WC PHYS LEVEL 2 - EST PT ICD-10 Diagnosis Description S80.211D Abrasion, right knee, subsequent encounter L98.498 Non-pressure chronic ulcer of skin of other sites with other specified severity Quantity: 1 Electronic Signature(s) Signed: 03/29/2020 5:22:09 PM By: Baltazar Najjar MD Entered By: Baltazar Najjar on 03/29/2020  17:18:43

## 2020-04-05 DIAGNOSIS — F79 Unspecified intellectual disabilities: Secondary | ICD-10-CM | POA: Diagnosis not present

## 2020-04-05 DIAGNOSIS — R269 Unspecified abnormalities of gait and mobility: Secondary | ICD-10-CM | POA: Diagnosis not present

## 2020-04-05 DIAGNOSIS — R531 Weakness: Secondary | ICD-10-CM | POA: Diagnosis not present

## 2020-04-05 DIAGNOSIS — R2681 Unsteadiness on feet: Secondary | ICD-10-CM | POA: Diagnosis not present

## 2020-04-07 DIAGNOSIS — R2681 Unsteadiness on feet: Secondary | ICD-10-CM | POA: Diagnosis not present

## 2020-04-07 DIAGNOSIS — R269 Unspecified abnormalities of gait and mobility: Secondary | ICD-10-CM | POA: Diagnosis not present

## 2020-04-07 DIAGNOSIS — M722 Plantar fascial fibromatosis: Secondary | ICD-10-CM | POA: Diagnosis not present

## 2020-04-07 DIAGNOSIS — F79 Unspecified intellectual disabilities: Secondary | ICD-10-CM | POA: Diagnosis not present

## 2020-04-07 DIAGNOSIS — M79671 Pain in right foot: Secondary | ICD-10-CM | POA: Diagnosis not present

## 2020-04-07 DIAGNOSIS — M6701 Short Achilles tendon (acquired), right ankle: Secondary | ICD-10-CM | POA: Diagnosis not present

## 2020-04-12 DIAGNOSIS — F79 Unspecified intellectual disabilities: Secondary | ICD-10-CM | POA: Diagnosis not present

## 2020-04-12 DIAGNOSIS — R2681 Unsteadiness on feet: Secondary | ICD-10-CM | POA: Diagnosis not present

## 2020-04-12 DIAGNOSIS — R269 Unspecified abnormalities of gait and mobility: Secondary | ICD-10-CM | POA: Diagnosis not present

## 2020-04-14 DIAGNOSIS — R269 Unspecified abnormalities of gait and mobility: Secondary | ICD-10-CM | POA: Diagnosis not present

## 2020-04-14 DIAGNOSIS — F79 Unspecified intellectual disabilities: Secondary | ICD-10-CM | POA: Diagnosis not present

## 2020-04-14 DIAGNOSIS — R2681 Unsteadiness on feet: Secondary | ICD-10-CM | POA: Diagnosis not present

## 2020-04-14 DIAGNOSIS — R531 Weakness: Secondary | ICD-10-CM | POA: Diagnosis not present

## 2020-04-19 DIAGNOSIS — F79 Unspecified intellectual disabilities: Secondary | ICD-10-CM | POA: Diagnosis not present

## 2020-04-19 DIAGNOSIS — R2681 Unsteadiness on feet: Secondary | ICD-10-CM | POA: Diagnosis not present

## 2020-04-19 DIAGNOSIS — R531 Weakness: Secondary | ICD-10-CM | POA: Diagnosis not present

## 2020-04-19 DIAGNOSIS — R269 Unspecified abnormalities of gait and mobility: Secondary | ICD-10-CM | POA: Diagnosis not present

## 2020-04-21 DIAGNOSIS — L03031 Cellulitis of right toe: Secondary | ICD-10-CM | POA: Diagnosis not present

## 2020-04-21 DIAGNOSIS — R2681 Unsteadiness on feet: Secondary | ICD-10-CM | POA: Diagnosis not present

## 2020-04-21 DIAGNOSIS — R269 Unspecified abnormalities of gait and mobility: Secondary | ICD-10-CM | POA: Diagnosis not present

## 2020-04-21 DIAGNOSIS — M722 Plantar fascial fibromatosis: Secondary | ICD-10-CM | POA: Diagnosis not present

## 2020-04-21 DIAGNOSIS — F79 Unspecified intellectual disabilities: Secondary | ICD-10-CM | POA: Diagnosis not present

## 2020-04-26 DIAGNOSIS — R269 Unspecified abnormalities of gait and mobility: Secondary | ICD-10-CM | POA: Diagnosis not present

## 2020-04-26 DIAGNOSIS — R531 Weakness: Secondary | ICD-10-CM | POA: Diagnosis not present

## 2020-04-26 DIAGNOSIS — F79 Unspecified intellectual disabilities: Secondary | ICD-10-CM | POA: Diagnosis not present

## 2020-04-26 DIAGNOSIS — F3342 Major depressive disorder, recurrent, in full remission: Secondary | ICD-10-CM | POA: Diagnosis not present

## 2020-04-26 DIAGNOSIS — R2681 Unsteadiness on feet: Secondary | ICD-10-CM | POA: Diagnosis not present

## 2020-04-26 DIAGNOSIS — G809 Cerebral palsy, unspecified: Secondary | ICD-10-CM | POA: Diagnosis not present

## 2020-04-28 DIAGNOSIS — F79 Unspecified intellectual disabilities: Secondary | ICD-10-CM | POA: Diagnosis not present

## 2020-04-28 DIAGNOSIS — R2681 Unsteadiness on feet: Secondary | ICD-10-CM | POA: Diagnosis not present

## 2020-04-28 DIAGNOSIS — R269 Unspecified abnormalities of gait and mobility: Secondary | ICD-10-CM | POA: Diagnosis not present

## 2020-04-28 DIAGNOSIS — R972 Elevated prostate specific antigen [PSA]: Secondary | ICD-10-CM | POA: Diagnosis not present

## 2020-05-03 DIAGNOSIS — F79 Unspecified intellectual disabilities: Secondary | ICD-10-CM | POA: Diagnosis not present

## 2020-05-03 DIAGNOSIS — R2681 Unsteadiness on feet: Secondary | ICD-10-CM | POA: Diagnosis not present

## 2020-05-03 DIAGNOSIS — R531 Weakness: Secondary | ICD-10-CM | POA: Diagnosis not present

## 2020-05-03 DIAGNOSIS — R269 Unspecified abnormalities of gait and mobility: Secondary | ICD-10-CM | POA: Diagnosis not present

## 2020-05-05 DIAGNOSIS — R300 Dysuria: Secondary | ICD-10-CM | POA: Diagnosis not present

## 2020-05-05 DIAGNOSIS — R972 Elevated prostate specific antigen [PSA]: Secondary | ICD-10-CM | POA: Diagnosis not present

## 2020-05-05 DIAGNOSIS — F79 Unspecified intellectual disabilities: Secondary | ICD-10-CM | POA: Diagnosis not present

## 2020-05-05 DIAGNOSIS — R338 Other retention of urine: Secondary | ICD-10-CM | POA: Diagnosis not present

## 2020-05-05 DIAGNOSIS — R2681 Unsteadiness on feet: Secondary | ICD-10-CM | POA: Diagnosis not present

## 2020-05-05 DIAGNOSIS — R269 Unspecified abnormalities of gait and mobility: Secondary | ICD-10-CM | POA: Diagnosis not present

## 2020-05-10 DIAGNOSIS — R531 Weakness: Secondary | ICD-10-CM | POA: Diagnosis not present

## 2020-05-10 DIAGNOSIS — F79 Unspecified intellectual disabilities: Secondary | ICD-10-CM | POA: Diagnosis not present

## 2020-05-10 DIAGNOSIS — R2681 Unsteadiness on feet: Secondary | ICD-10-CM | POA: Diagnosis not present

## 2020-05-10 DIAGNOSIS — R269 Unspecified abnormalities of gait and mobility: Secondary | ICD-10-CM | POA: Diagnosis not present

## 2020-05-17 DIAGNOSIS — R269 Unspecified abnormalities of gait and mobility: Secondary | ICD-10-CM | POA: Diagnosis not present

## 2020-05-17 DIAGNOSIS — R2681 Unsteadiness on feet: Secondary | ICD-10-CM | POA: Diagnosis not present

## 2020-05-17 DIAGNOSIS — F79 Unspecified intellectual disabilities: Secondary | ICD-10-CM | POA: Diagnosis not present

## 2020-05-18 DIAGNOSIS — N3000 Acute cystitis without hematuria: Secondary | ICD-10-CM | POA: Diagnosis not present

## 2020-05-18 DIAGNOSIS — R338 Other retention of urine: Secondary | ICD-10-CM | POA: Diagnosis not present

## 2020-05-24 DIAGNOSIS — R338 Other retention of urine: Secondary | ICD-10-CM | POA: Diagnosis not present

## 2020-06-09 DIAGNOSIS — N3000 Acute cystitis without hematuria: Secondary | ICD-10-CM | POA: Diagnosis not present

## 2020-06-09 DIAGNOSIS — R338 Other retention of urine: Secondary | ICD-10-CM | POA: Diagnosis not present

## 2020-06-14 DIAGNOSIS — R338 Other retention of urine: Secondary | ICD-10-CM | POA: Diagnosis not present

## 2020-06-15 DIAGNOSIS — N400 Enlarged prostate without lower urinary tract symptoms: Secondary | ICD-10-CM | POA: Diagnosis not present

## 2020-06-15 DIAGNOSIS — H9190 Unspecified hearing loss, unspecified ear: Secondary | ICD-10-CM | POA: Diagnosis not present

## 2020-06-15 DIAGNOSIS — M6281 Muscle weakness (generalized): Secondary | ICD-10-CM | POA: Diagnosis not present

## 2020-06-15 DIAGNOSIS — F418 Other specified anxiety disorders: Secondary | ICD-10-CM | POA: Diagnosis not present

## 2020-06-15 DIAGNOSIS — F79 Unspecified intellectual disabilities: Secondary | ICD-10-CM | POA: Diagnosis not present

## 2020-06-15 DIAGNOSIS — R972 Elevated prostate specific antigen [PSA]: Secondary | ICD-10-CM | POA: Diagnosis not present

## 2020-06-15 DIAGNOSIS — I10 Essential (primary) hypertension: Secondary | ICD-10-CM | POA: Diagnosis not present

## 2020-06-15 DIAGNOSIS — H547 Unspecified visual loss: Secondary | ICD-10-CM | POA: Diagnosis not present

## 2020-06-15 DIAGNOSIS — K279 Peptic ulcer, site unspecified, unspecified as acute or chronic, without hemorrhage or perforation: Secondary | ICD-10-CM | POA: Diagnosis not present

## 2020-06-15 DIAGNOSIS — R269 Unspecified abnormalities of gait and mobility: Secondary | ICD-10-CM | POA: Diagnosis not present

## 2020-06-15 DIAGNOSIS — Z66 Do not resuscitate: Secondary | ICD-10-CM | POA: Diagnosis not present

## 2020-06-15 DIAGNOSIS — M4125 Other idiopathic scoliosis, thoracolumbar region: Secondary | ICD-10-CM | POA: Diagnosis not present

## 2020-06-22 DIAGNOSIS — N401 Enlarged prostate with lower urinary tract symptoms: Secondary | ICD-10-CM | POA: Diagnosis not present

## 2020-06-22 DIAGNOSIS — R338 Other retention of urine: Secondary | ICD-10-CM | POA: Diagnosis not present

## 2020-06-24 ENCOUNTER — Other Ambulatory Visit: Payer: Self-pay | Admitting: Urology

## 2020-06-29 NOTE — Patient Instructions (Signed)
DUE TO COVID-19 ONLY ONE VISITOR IS ALLOWED TO COME WITH YOU AND STAY IN THE WAITING ROOM ONLY DURING PRE OP AND PROCEDURE DAY OF SURGERY. THE 1 VISITOR  MAY VISIT WITH YOU AFTER SURGERY IN YOUR PRIVATE ROOM DURING VISITING HOURS ONLY!                Jeffery Fox    Your procedure is scheduled on: 07/04/20   Report to [facility] Main  Entrance   Report to short stay at: 5:15 AM     Call this number if you have problems the morning of surgery [phone]    Remember: Do not eat food or drink liquids :After Midnight.   BRUSH YOUR TEETH MORNING OF SURGERY AND RINSE YOUR MOUTH OUT, NO CHEWING GUM CANDY OR MINTS.    Take these medicines the morning of surgery with A SIP OF WATER:cartia,cetirizine,famotidine,tamsulosin.Eye drops as usual.               You may not have any metal on your body including hair pins and              piercings  Do not wear jewelry, lotions, powders or perfumes, deodorant             Men may shave face and neck.   Do not bring valuables to the hospital. [facility] IS NOT             RESPONSIBLE   FOR VALUABLES.  Contacts, dentures or bridgework may not be worn into surgery.  Leave suitcase in the car. After surgery it may be brought to your room.     Patients discharged the day of surgery will not be allowed to drive home. IF YOU ARE HAVING SURGERY AND GOING HOME THE SAME DAY, YOU MUST HAVE AN ADULT TO DRIVE YOU HOME AND BE WITH YOU FOR 24 HOURS. YOU MAY GO HOME BY TAXI OR UBER OR ORTHERWISE, BUT AN ADULT MUST ACCOMPANY YOU HOME AND STAY WITH YOU FOR 24 HOURS.  Name and phone number of your driver:  Special Instructions: N/A              Please read over the following fact sheets you were given: _____________________________________________________________________         [facility] - Preparing for Surgery Before surgery, you can play an important role.  Because skin is not sterile, your skin needs to be as free of germs as possible.  You can  reduce the number of germs on your skin by washing with CHG (chlorahexidine gluconate) soap before surgery.  CHG is an antiseptic cleaner which kills germs and bonds with the skin to continue killing germs even after washing. Please DO NOT use if you have an allergy to CHG or antibacterial soaps.  If your skin becomes reddened/irritated stop using the CHG and inform your nurse when you arrive at Short Stay. Do not shave (including legs and underarms) for at least 48 hours prior to the first CHG shower.  You may shave your face/neck. Please follow these instructions carefully:  1.  Shower with CHG Soap the night before surgery and the  morning of Surgery.  2.  If you choose to wash your hair, wash your hair first as usual with your  normal  shampoo.  3.  After you shampoo, rinse your hair and body thoroughly to remove the  shampoo.  4.  Use CHG as you would any other liquid soap.  You can apply chg directly  to the skin and wash                       Gently with a scrungie or clean washcloth.  5.  Apply the CHG Soap to your body ONLY FROM THE NECK DOWN.   Do not use on face/ open                           Wound or open sores. Avoid contact with eyes, ears mouth and genitals (private parts).                       Wash face,  Genitals (private parts) with your normal soap.             6.  Wash thoroughly, paying special attention to the area where your surgery  will be performed.  7.  Thoroughly rinse your body with warm water from the neck down.  8.  DO NOT shower/wash with your normal soap after using and rinsing off  the CHG Soap.                9.  Pat yourself dry with a clean towel.            10.  Wear clean pajamas.            11.  Place clean sheets on your bed the night of your first shower and do not  sleep with pets. Day of Surgery : Do not apply any lotions/deodorants the morning of surgery.  Please wear clean clothes to the hospital/surgery center.  FAILURE TO  FOLLOW THESE INSTRUCTIONS MAY RESULT IN THE CANCELLATION OF YOUR SURGERY PATIENT SIGNATURE_________________________________  NURSE SIGNATURE__________________________________  ________________________________________________________________________

## 2020-06-30 ENCOUNTER — Encounter (HOSPITAL_COMMUNITY)
Admission: RE | Admit: 2020-06-30 | Discharge: 2020-06-30 | Disposition: A | Discharge status: Home / Self Care | Payer: Medicare Other | Source: Ambulatory Visit | Attending: Urology | Admitting: Urology

## 2020-06-30 ENCOUNTER — Encounter (HOSPITAL_COMMUNITY): Payer: Self-pay

## 2020-06-30 ENCOUNTER — Other Ambulatory Visit: Payer: Self-pay

## 2020-06-30 DIAGNOSIS — Z01812 Encounter for preprocedural laboratory examination: Secondary | ICD-10-CM | POA: Diagnosis not present

## 2020-06-30 HISTORY — DX: Depression, unspecified: F32.A

## 2020-06-30 HISTORY — DX: Essential (primary) hypertension: I10

## 2020-06-30 HISTORY — DX: Unspecified osteoarthritis, unspecified site: M19.90

## 2020-06-30 HISTORY — DX: Cerebral infarction, unspecified: I63.9

## 2020-06-30 LAB — CBC
HCT: 37.8 % — ABNORMAL LOW (ref 39.0–52.0)
Hemoglobin: 12.4 g/dL — ABNORMAL LOW (ref 13.0–17.0)
MCH: 34.4 pg — ABNORMAL HIGH (ref 26.0–34.0)
MCHC: 32.8 g/dL (ref 30.0–36.0)
MCV: 105 fL — ABNORMAL HIGH (ref 80.0–100.0)
Platelets: 265 10*3/uL (ref 150–400)
RBC: 3.6 MIL/uL — ABNORMAL LOW (ref 4.22–5.81)
RDW: 10.9 % — ABNORMAL LOW (ref 11.5–15.5)
WBC: 6.9 10*3/uL (ref 4.0–10.5)
nRBC: 0 % (ref 0.0–0.2)

## 2020-06-30 LAB — BASIC METABOLIC PANEL
Anion gap: 6 (ref 5–15)
BUN: 17 mg/dL (ref 8–23)
CO2: 25 mmol/L (ref 22–32)
Calcium: 9.4 mg/dL (ref 8.9–10.3)
Chloride: 103 mmol/L (ref 98–111)
Creatinine, Ser: 0.37 mg/dL — ABNORMAL LOW (ref 0.61–1.24)
GFR, Estimated: >60 mL/min (ref >60)
Glucose, Bld: 116 mg/dL — ABNORMAL HIGH (ref 70–99)
Potassium: 4 mmol/L (ref 3.5–5.1)
Sodium: 134 mmol/L — ABNORMAL LOW (ref 135–145)

## 2020-06-30 NOTE — Progress Notes (Signed)
COVID Vaccine Completed: Yes Date COVID Vaccine completed: 12/08/19 [place] COVID vaccine manufacturer: Pfizer     PCP - [facility] Cardiologist -   Chest x-ray -  EKG -  Stress Test -  ECHO -  Cardiac Cath -  Pacemaker/ICD device last checked:  Sleep Study -  CPAP -   Fasting Blood Sugar -  Checks Blood Sugar _____ times a day  Blood Thinner Instructions: Aspirin Instructions: Last Dose:  Anesthesia review: Hx: HTN,CVA  Patient denies shortness of breath, fever, cough and chest pain at PAT appointment   Patient verbalized understanding of instructions that were given to them at the PAT appointment. Patient was also instructed that they will need to review over the PAT instructions again at home before surgery.

## 2020-07-01 ENCOUNTER — Other Ambulatory Visit (HOSPITAL_COMMUNITY)
Admission: RE | Admit: 2020-07-01 | Discharge: 2020-07-01 | Disposition: A | Discharge status: Home / Self Care | Payer: Medicare Other | Source: Ambulatory Visit | Attending: Urology | Admitting: Urology

## 2020-07-01 DIAGNOSIS — Z20822 Contact with and (suspected) exposure to covid-19: Secondary | ICD-10-CM | POA: Diagnosis not present

## 2020-07-01 DIAGNOSIS — Z01812 Encounter for preprocedural laboratory examination: Secondary | ICD-10-CM | POA: Diagnosis not present

## 2020-07-01 LAB — SARS CORONAVIRUS 2 (TAT 6-24 HRS): SARS Coronavirus 2: NEGATIVE

## 2020-07-04 ENCOUNTER — Encounter (HOSPITAL_COMMUNITY): Payer: Self-pay | Admitting: Urology

## 2020-07-04 ENCOUNTER — Ambulatory Visit (HOSPITAL_COMMUNITY): Payer: Medicare Other | Admitting: Certified Registered Nurse Anesthetist

## 2020-07-04 ENCOUNTER — Other Ambulatory Visit: Payer: Self-pay

## 2020-07-04 ENCOUNTER — Ambulatory Visit (HOSPITAL_COMMUNITY): Payer: Medicare Other | Admitting: Physician Assistant

## 2020-07-04 ENCOUNTER — Observation Stay (HOSPITAL_COMMUNITY)
Admission: RE | Admit: 2020-07-04 | Discharge: 2020-07-05 | Disposition: A | Discharge status: Home / Self Care | Payer: Medicare Other | Attending: Urology | Admitting: Urology

## 2020-07-04 ENCOUNTER — Encounter (HOSPITAL_COMMUNITY)
Admission: RE | Disposition: A | Discharge status: Home / Self Care | Payer: Self-pay | Source: Home / Self Care | Attending: Urology

## 2020-07-04 DIAGNOSIS — N401 Enlarged prostate with lower urinary tract symptoms: Principal | ICD-10-CM | POA: Insufficient documentation

## 2020-07-04 DIAGNOSIS — N400 Enlarged prostate without lower urinary tract symptoms: Secondary | ICD-10-CM | POA: Diagnosis not present

## 2020-07-04 DIAGNOSIS — R338 Other retention of urine: Secondary | ICD-10-CM | POA: Insufficient documentation

## 2020-07-04 DIAGNOSIS — R972 Elevated prostate specific antigen [PSA]: Secondary | ICD-10-CM | POA: Insufficient documentation

## 2020-07-04 DIAGNOSIS — R300 Dysuria: Secondary | ICD-10-CM | POA: Diagnosis present

## 2020-07-04 DIAGNOSIS — I10 Essential (primary) hypertension: Secondary | ICD-10-CM | POA: Diagnosis not present

## 2020-07-04 DIAGNOSIS — N138 Other obstructive and reflux uropathy: Secondary | ICD-10-CM | POA: Diagnosis not present

## 2020-07-04 DIAGNOSIS — F32A Depression, unspecified: Secondary | ICD-10-CM | POA: Diagnosis not present

## 2020-07-04 HISTORY — PX: TRANSURETHRAL RESECTION OF PROSTATE: SHX73

## 2020-07-04 LAB — TYPE AND SCREEN
ABO/RH(D): A POS
Antibody Screen: NEGATIVE

## 2020-07-04 LAB — ABO/RH: ABO/RH(D): A POS

## 2020-07-04 SURGERY — TURP (TRANSURETHRAL RESECTION OF PROSTATE)
Anesthesia: General | Site: Prostate

## 2020-07-04 MED ORDER — PROPOFOL 10 MG/ML IV BOLUS
INTRAVENOUS | Status: DC | PRN
  Administered 2020-07-04: 140 mg via INTRAVENOUS

## 2020-07-04 MED ORDER — LORATADINE 10 MG PO TABS
10.0000 mg | ORAL_TABLET | Freq: Every day | ORAL | Status: DC
  Administered 2020-07-04 – 2020-07-05 (×2): 10 mg via ORAL
  Filled 2020-07-04 (×2): qty 1

## 2020-07-04 MED ORDER — DICLOFENAC SODIUM 1 % EX GEL: 2.0000 g | Freq: Every day | CUTANEOUS | Status: DC | PRN

## 2020-07-04 MED ORDER — SODIUM CHLORIDE 0.9 % IV SOLN: INTRAVENOUS | Status: DC

## 2020-07-04 MED ORDER — LIDOCAINE 2% (20 MG/ML) 5 ML SYRINGE
INTRAMUSCULAR | Status: DC | PRN
  Administered 2020-07-04: 60 mg via INTRAVENOUS

## 2020-07-04 MED ORDER — DIPHENHYDRAMINE HCL 50 MG/ML IJ SOLN: 12.5000 mg | Freq: Four times a day (QID) | INTRAMUSCULAR | Status: DC | PRN

## 2020-07-04 MED ORDER — CHLORHEXIDINE GLUCONATE 0.12 % MT SOLN
15.0000 mL | Freq: Once | OROMUCOSAL | Status: AC
  Administered 2020-07-04: 15 mL via OROMUCOSAL

## 2020-07-04 MED ORDER — OLOPATADINE HCL 0.1 % OP SOLN
1.0000 [drp] | Freq: Every day | OPHTHALMIC | Status: DC
  Filled 2020-07-04: qty 5

## 2020-07-04 MED ORDER — FENTANYL CITRATE (PF) 100 MCG/2ML IJ SOLN
INTRAMUSCULAR | Status: AC
  Filled 2020-07-04: qty 2

## 2020-07-04 MED ORDER — 0.9 % SODIUM CHLORIDE (POUR BTL) OPTIME
TOPICAL | Status: DC | PRN
  Administered 2020-07-04: 1000 mL

## 2020-07-04 MED ORDER — SODIUM CHLORIDE 0.9 % IR SOLN
3000.0000 mL | Status: DC
  Administered 2020-07-05: 3000 mL

## 2020-07-04 MED ORDER — ONDANSETRON HCL 4 MG/2ML IJ SOLN: 4.0000 mg | INTRAMUSCULAR | Status: DC | PRN

## 2020-07-04 MED ORDER — SODIUM CHLORIDE 0.9 % IR SOLN
Status: DC | PRN
  Administered 2020-07-04 (×8): 3000 mL

## 2020-07-04 MED ORDER — SENNOSIDES-DOCUSATE SODIUM 8.6-50 MG PO TABS
2.0000 | ORAL_TABLET | Freq: Every day | ORAL | Status: DC
  Administered 2020-07-04: 2 via ORAL
  Filled 2020-07-04: qty 2

## 2020-07-04 MED ORDER — MELATONIN 3 MG PO TABS
3.0000 mg | ORAL_TABLET | Freq: Every day | ORAL | Status: DC
  Administered 2020-07-04: 3 mg via ORAL
  Filled 2020-07-04: qty 1

## 2020-07-04 MED ORDER — DORZOLAMIDE HCL-TIMOLOL MAL 2-0.5 % OP SOLN
1.0000 [drp] | Freq: Every day | OPHTHALMIC | Status: DC
  Filled 2020-07-04: qty 10

## 2020-07-04 MED ORDER — DEXAMETHASONE SODIUM PHOSPHATE 10 MG/ML IJ SOLN
INTRAMUSCULAR | Status: AC
  Filled 2020-07-04: qty 1

## 2020-07-04 MED ORDER — CEFAZOLIN SODIUM-DEXTROSE 2-4 GM/100ML-% IV SOLN
2.0000 g | INTRAVENOUS | Status: AC
  Administered 2020-07-04: 2 g via INTRAVENOUS
  Filled 2020-07-04: qty 100

## 2020-07-04 MED ORDER — BACITRACIN-NEOMYCIN-POLYMYXIN 400-5-5000 EX OINT: 1.0000 "application " | TOPICAL_OINTMENT | Freq: Three times a day (TID) | CUTANEOUS | Status: DC | PRN

## 2020-07-04 MED ORDER — ONDANSETRON HCL 4 MG/2ML IJ SOLN
INTRAMUSCULAR | Status: DC | PRN
  Administered 2020-07-04: 4 mg via INTRAVENOUS

## 2020-07-04 MED ORDER — RISPERIDONE 1 MG PO TABS
2.0000 mg | ORAL_TABLET | Freq: Every day | ORAL | Status: DC
  Administered 2020-07-04: 2 mg via ORAL
  Filled 2020-07-04: qty 2

## 2020-07-04 MED ORDER — ZOLPIDEM TARTRATE 5 MG PO TABS: 5.0000 mg | ORAL_TABLET | Freq: Every evening | ORAL | Status: DC | PRN

## 2020-07-04 MED ORDER — DEXAMETHASONE SODIUM PHOSPHATE 10 MG/ML IJ SOLN
INTRAMUSCULAR | Status: DC | PRN
  Administered 2020-07-04: 5 mg via INTRAVENOUS

## 2020-07-04 MED ORDER — PHENYLEPHRINE 40 MCG/ML (10ML) SYRINGE FOR IV PUSH (FOR BLOOD PRESSURE SUPPORT)
PREFILLED_SYRINGE | INTRAVENOUS | Status: DC | PRN
  Administered 2020-07-04 (×3): 160 ug via INTRAVENOUS

## 2020-07-04 MED ORDER — ACETAMINOPHEN 325 MG PO TABS: 650.0000 mg | ORAL_TABLET | ORAL | Status: DC | PRN

## 2020-07-04 MED ORDER — DILTIAZEM HCL ER COATED BEADS 120 MG PO CP24
120.0000 mg | ORAL_CAPSULE | Freq: Every day | ORAL | Status: DC
  Administered 2020-07-04 – 2020-07-05 (×2): 120 mg via ORAL
  Filled 2020-07-04 (×2): qty 1

## 2020-07-04 MED ORDER — ESCITALOPRAM OXALATE 10 MG PO TABS
10.0000 mg | ORAL_TABLET | Freq: Every evening | ORAL | Status: DC
  Administered 2020-07-04: 10 mg via ORAL
  Filled 2020-07-04: qty 1

## 2020-07-04 MED ORDER — EPHEDRINE SULFATE-NACL 50-0.9 MG/10ML-% IV SOSY
PREFILLED_SYRINGE | INTRAVENOUS | Status: DC | PRN
  Administered 2020-07-04 (×2): 10 mg via INTRAVENOUS
  Administered 2020-07-04: 5 mg via INTRAVENOUS

## 2020-07-04 MED ORDER — AMOXICILLIN-POT CLAVULANATE 875-125 MG PO TABS
1.0000 | ORAL_TABLET | Freq: Two times a day (BID) | ORAL | Status: DC
  Administered 2020-07-04 – 2020-07-05 (×2): 1 via ORAL
  Filled 2020-07-04 (×2): qty 1

## 2020-07-04 MED ORDER — ONDANSETRON HCL 4 MG/2ML IJ SOLN
INTRAMUSCULAR | Status: AC
  Filled 2020-07-04: qty 2

## 2020-07-04 MED ORDER — FAMOTIDINE 20 MG PO TABS
20.0000 mg | ORAL_TABLET | Freq: Every day | ORAL | Status: DC
  Administered 2020-07-04 – 2020-07-05 (×2): 20 mg via ORAL
  Filled 2020-07-04 (×2): qty 1

## 2020-07-04 MED ORDER — LACTATED RINGERS IV SOLN: INTRAVENOUS | Status: DC

## 2020-07-04 MED ORDER — FENTANYL CITRATE (PF) 100 MCG/2ML IJ SOLN
INTRAMUSCULAR | Status: DC | PRN
  Administered 2020-07-04 (×2): 25 ug via INTRAVENOUS

## 2020-07-04 MED ORDER — BELLADONNA ALKALOIDS-OPIUM 16.2-60 MG RE SUPP: 1.0000 | Freq: Four times a day (QID) | RECTAL | Status: DC | PRN

## 2020-07-04 MED ORDER — HYDROXYZINE HCL 25 MG PO TABS: 25.0000 mg | ORAL_TABLET | Freq: Three times a day (TID) | ORAL | Status: DC | PRN

## 2020-07-04 MED ORDER — FENTANYL CITRATE (PF) 100 MCG/2ML IJ SOLN: 25.0000 ug | INTRAMUSCULAR | Status: DC | PRN

## 2020-07-04 MED ORDER — MIDAZOLAM HCL 2 MG/2ML IJ SOLN
INTRAMUSCULAR | Status: AC
  Filled 2020-07-04: qty 2

## 2020-07-04 MED ORDER — PROMETHAZINE HCL 25 MG/ML IJ SOLN
6.2500 mg | INTRAMUSCULAR | Status: DC | PRN
Start: 2020-07-04 — End: 2020-07-04

## 2020-07-04 MED ORDER — BUSPIRONE HCL 5 MG PO TABS
7.5000 mg | ORAL_TABLET | Freq: Every day | ORAL | Status: DC
  Administered 2020-07-04: 7.5 mg via ORAL
  Filled 2020-07-04: qty 2

## 2020-07-04 MED ORDER — MIDAZOLAM HCL 5 MG/5ML IJ SOLN
INTRAMUSCULAR | Status: DC | PRN
  Administered 2020-07-04: 1 mg via INTRAVENOUS

## 2020-07-04 MED ORDER — OXYCODONE HCL 5 MG PO TABS: 5.0000 mg | ORAL_TABLET | ORAL | Status: DC | PRN

## 2020-07-04 MED ORDER — MORPHINE SULFATE (PF) 2 MG/ML IV SOLN: 2.0000 mg | INTRAVENOUS | Status: DC | PRN

## 2020-07-04 MED ORDER — ORAL CARE MOUTH RINSE: 15.0000 mL | Freq: Once | OROMUCOSAL | Status: AC

## 2020-07-04 MED ORDER — OXYBUTYNIN CHLORIDE 5 MG PO TABS: 5.0000 mg | ORAL_TABLET | Freq: Three times a day (TID) | ORAL | Status: DC | PRN

## 2020-07-04 MED ORDER — ACETAMINOPHEN 10 MG/ML IV SOLN: 1000.0000 mg | Freq: Once | INTRAVENOUS | Status: DC | PRN

## 2020-07-04 MED ORDER — DIPHENHYDRAMINE HCL 12.5 MG/5ML PO ELIX: 12.5000 mg | ORAL_SOLUTION | Freq: Four times a day (QID) | ORAL | Status: DC | PRN

## 2020-07-04 MED ORDER — PROPOFOL 10 MG/ML IV BOLUS
INTRAVENOUS | Status: AC
  Filled 2020-07-04: qty 20

## 2020-07-04 MED ORDER — LIDOCAINE 2% (20 MG/ML) 5 ML SYRINGE
INTRAMUSCULAR | Status: AC
  Filled 2020-07-04: qty 5

## 2020-07-04 SURGICAL SUPPLY — 20 items
BAG DRN RND TRDRP ANRFLXCHMBR (UROLOGICAL SUPPLIES) ×1
BAG URINE DRAIN 2000ML AR STRL (UROLOGICAL SUPPLIES) ×2 IMPLANT
BAG URO CATCHER STRL LF (MISCELLANEOUS) ×2 IMPLANT
CATH FOLEY 3WAY 30CC 24FR (CATHETERS) ×2
CATH URTH STD 24FR FL 3W 2 (CATHETERS) ×1 IMPLANT
CLOTH BEACON ORANGE TIMEOUT ST (SAFETY) ×2 IMPLANT
DRAPE FOOT SWITCH (DRAPES) ×2 IMPLANT
ELECT REM PT RETURN 15FT ADLT (MISCELLANEOUS) ×2 IMPLANT
GLOVE SURG ENC MOIS LTX SZ7.5 (GLOVE) ×2 IMPLANT
GOWN STRL REUS W/TWL XL LVL3 (GOWN DISPOSABLE) ×2 IMPLANT
HOLDER FOLEY CATH W/STRAP (MISCELLANEOUS) ×2 IMPLANT
KIT TURNOVER KIT A (KITS) ×2 IMPLANT
LOOP CUT BIPOLAR 24F LRG (ELECTROSURGICAL) ×2 IMPLANT
MANIFOLD NEPTUNE II (INSTRUMENTS) ×2 IMPLANT
PACK CYSTO (CUSTOM PROCEDURE TRAY) ×2 IMPLANT
PENCIL SMOKE EVACUATOR (MISCELLANEOUS) IMPLANT
SYR TOOMEY IRRIG 70ML (MISCELLANEOUS) ×2
SYRINGE TOOMEY IRRIG 70ML (MISCELLANEOUS) ×1 IMPLANT
TUBING CONNECTING 10 (TUBING) ×2 IMPLANT
TUBING UROLOGY SET (TUBING) ×2 IMPLANT

## 2020-07-04 NOTE — Anesthesia Procedure Notes (Signed)
Procedure Name: LMA Insertion Performed by: Zimri Brennen A, CRNA Pre-anesthesia Checklist: Patient identified, Emergency Drugs available, Suction available and Patient being monitored Patient Re-evaluated:Patient Re-evaluated prior to induction Oxygen Delivery Method: Circle system utilized Preoxygenation: Pre-oxygenation with 100% oxygen Induction Type: IV induction LMA: LMA inserted LMA Size: 4.0 Number of attempts: 1 Placement Confirmation: positive ETCO2 and breath sounds checked- equal and bilateral Tube secured with: Tape Dental Injury: Teeth and Oropharynx as per pre-operative assessment        

## 2020-07-04 NOTE — Plan of Care (Signed)
  Problem: Clinical Measurements: Goal: Ability to maintain clinical measurements within normal limits will improve Outcome: Progressing Goal: Postoperative complications will be avoided or minimized Outcome: Progressing   Problem: Skin Integrity: Goal: Demonstration of wound healing without infection will improve Outcome: Progressing   Problem: Clinical Measurements: Goal: Will remain free from infection Outcome: Progressing

## 2020-07-04 NOTE — Discharge Instructions (Signed)

## 2020-07-04 NOTE — Anesthesia Postprocedure Evaluation (Signed)
Anesthesia Post Note  Patient: Jeffery Fox  Procedure(s) Performed: TRANSURETHRAL RESECTION OF THE PROSTATE (TURP) (N/A Prostate)     Patient location during evaluation: PACU Anesthesia Type: General Level of consciousness: awake and alert Pain management: pain level controlled Vital Signs Assessment: post-procedure vital signs reviewed and stable Respiratory status: spontaneous breathing, nonlabored ventilation, respiratory function stable and patient connected to nasal cannula oxygen Cardiovascular status: blood pressure returned to baseline and stable Postop Assessment: no apparent nausea or vomiting Anesthetic complications: no   No complications documented.  Last Vitals:  Vitals:   07/04/20 1400 07/04/20 1510  BP: 139/77 127/76  Pulse: 95 (!) 101  Resp: 16 18  Temp: 36.6 C 36.9 C  SpO2: 94% 94%    Last Pain:  Vitals:   07/04/20 1510  TempSrc: Axillary  PainSc: 0-No pain                 Earl Lites P Breya Cass

## 2020-07-04 NOTE — Op Note (Signed)
Preoperative diagnosis: 1. Bladder outlet obstruction secondary to BPH  Postoperative diagnosis:  1. Bladder outlet obstruction secondary to BPH  Procedure:  1. Cystoscopy 2. Transurethral resection of the prostate  Surgeon: Ray Church, III. M.D.  Anesthesia: General  Complications: None  EBL: Minimal  Specimens: 1. Prostate chips  Indication: Jeffery Fox is a patient with bladder outlet obstruction secondary to benign prostatic hyperplasia. After reviewing the management options for treatment, he elected to proceed with the above surgical procedure(s). We have discussed the potential benefits and risks of the procedure, side effects of the proposed treatment, the likelihood of the patient achieving the goals of the procedure, and any potential problems that might occur during the procedure or recuperation. Informed consent has been obtained.  Description of procedure:  The patient was taken to the operating room and general anesthesia was induced.  The patient was placed in the dorsal lithotomy position, prepped and draped in the usual sterile fashion, and preoperative antibiotics were administered. A preoperative time-out was performed.   Cystourethroscopy was performed.  The patient's urethra was examined and demonstrated bilobar prostatic hypertrophy.   The bladder was then systematically examined in its entirety. There was no evidence of any bladder tumors, stones, or other mucosal pathology.  The ureteral orifices were identified and marked so as to be avoided during the procedure.  The prostate adenoma was then resected utilizing loop cautery resection with the bipolar cutting loop.  The prostate adenoma from the bladder neck back to the verumontanum was resected beginning at the six o'clock position and then extended to include the right and left lobes of the prostate and anterior prostate. Care was taken not to resect distal to the verumontanum.  Hemostasis was then  achieved with the cautery and the bladder was emptied and reinspected with no significant bleeding noted at the end of the procedure.    A 24 French 3 way catheter was then placed into the bladder.  The patient appeared to tolerate the procedure well and without complications.  The patient was able to be awakened and transferred to the recovery unit in satisfactory condition.

## 2020-07-04 NOTE — Anesthesia Preprocedure Evaluation (Addendum)
Anesthesia Evaluation  Patient identified by MRN, date of birth, ID band Patient confused    Reviewed: Allergy & Precautions, NPO status , Patient's Chart, lab work & pertinent test results  Airway Mallampati: II  TM Distance: >3 FB Neck ROM: Full    Dental  (+) Upper Dentures, Lower Dentures   Pulmonary neg pulmonary ROS,    Pulmonary exam normal        Cardiovascular hypertension,  Rhythm:Regular Rate:Normal     Neuro/Psych Depression CVA (wheelchair bound with minimal motor function), Residual Symptoms    GI/Hepatic negative GI ROS, Neg liver ROS,   Endo/Other  negative endocrine ROS  Renal/GU negative Renal ROS Bladder dysfunction  BPH    Musculoskeletal  (+) Arthritis ,   Abdominal (+)  Abdomen: soft. Bowel sounds: normal.  Peds  Hematology negative hematology ROS (+)   Anesthesia Other Findings   Reproductive/Obstetrics                            Anesthesia Physical Anesthesia Plan  ASA: III  Anesthesia Plan: General   Post-op Pain Management:    Induction: Intravenous  PONV Risk Score and Plan: 2 and Ondansetron, Dexamethasone, Midazolam and Treatment may vary due to age or medical condition  Airway Management Planned: Mask and LMA  Additional Equipment: None  Intra-op Plan:   Post-operative Plan: Extubation in OR  Informed Consent: I have reviewed the patients History and Physical, chart, labs and discussed the procedure including the risks, benefits and alternatives for the proposed anesthesia with the patient or authorized representative who has indicated his/her understanding and acceptance.     Dental advisory given  Plan Discussed with: CRNA  Anesthesia Plan Comments: (Lab Results      Component                Value               Date                      WBC                      6.9                 06/30/2020                HGB                      12.4  (L)            06/30/2020                HCT                      37.8 (L)            06/30/2020                MCV                      105.0 (H)           06/30/2020                PLT                      265  06/30/2020           Lab Results      Component                Value               Date                      NA                       134 (L)             06/30/2020                K                        4.0                 06/30/2020                CO2                      25                  06/30/2020                GLUCOSE                  116 (H)             06/30/2020                BUN                      17                  06/30/2020                CREATININE               0.37 (L)            06/30/2020                CALCIUM                  9.4                 06/30/2020                GFRNONAA                 >60                 06/30/2020          )       Anesthesia Quick Evaluation

## 2020-07-04 NOTE — H&P (Signed)
CC/HPI: CC: Elevated PSA, dysuria  HPI:  03/17/2020  70 year old male who is currently in a nursing facility. He is in a wheelchair and minimally mobile. He is blind and very hard of hearing. He has multiple medical comorbidities. For the past several weeks, about 3 weeks, he has had dysuria. He has not had any antibiotics for treatment. Reportedly, urinalysis was negative. He was unable to provide a urine sample now. Urinalysis on 02/10/2020 was negative. Denies any Hematuria. He is currently on Flomax and oxybutynin. No other significant voiding complaints besides some urinary urgency on occasion. All history is provided by his son. PSA was checked and was 7.37 on 02/02/2020.   05/05/2020  After taking doxycycline, PSA is now normal at 4.6 for his age. As mentioned above, he has multiple medical problems. He is on oxybutynin Flomax. Bladder scan revealed greater than 1 L. he continues to have dysuria with each void.   06/22/2020  Patient underwent a urodynamics that revealed evidence of bladder outlet obstruction. He was able to generate a contraction. He is very bothered by the catheter and would like to try anything possible to have the catheter removed.   Urodynamics report:  UDS SUMMARY  Mr. Jeffery Fox held a max capacity of approx. 484 mls. He was unable to communicate most bladder sensations but did inform me that he had to urinate with more fluid in his bladder. There was positive instability with moderate leakage. He was unable to generate a voluntary contraction and void even though he informed me that he had to urinate. EMG leads were basically quiet during voiding. Max capacity was 484 mls. Trabeculation and some elevation of the bladder base was noted. No reflux was noted. A 16 french catheter was reinserted before patient left clinic.     ALLERGIES: Sulfa Antibiotics     MEDICATIONS: Tamsulosin Hcl 0.4 mg capsule  Benefiber  Cartia Xt  Cetirizine Hcl  Cosopt  Escitalopram Oxalate  10 mg tablet  Famotidine 20 mg tablet  Glucosamine  Hydrocodone-Acetaminophen  Melatonin 1 mg tablet  Methocarbamol 500 mg tablet  Miralax  Mobic  Multivitamin  Oxybutynin Chloride Er 10 mg tablet, extended release 24 hr  Probiotic  Risperidone 2 mg tablet  Tramadol Hcl  Voltaren Arthritis Pain 1 % gel     GU PSH: Complex cystometrogram, w/ void pressure and urethral pressure profile studies, any technique - 06/14/2020 Complex Uroflow - 06/14/2020 Emg surf Electrd - 06/14/2020 Inject For cystogram - 06/14/2020 Intrabd voidng Press - 06/14/2020     NON-GU PSH: Cataract surgery Glaucoma Surgery     GU PMH: Urinary Retention - 06/14/2020, foley replaced. Urine cx sent. Keep appt for UDS. , - 06/09/2020, - 05/24/2020, - 05/18/2020, - 05/05/2020 Acute Cystitis/UTI - 05/18/2020 Dysuria - 05/05/2020, - 03/17/2020 Elevated PSA - 05/05/2020, - 03/17/2020 Chronic prostatitis - 03/17/2020    NON-GU PMH: Acute gastric ulcer with hemorrhage Anxiety Arthritis Depression GERD Glaucoma Personal history of peptic ulcer disease    FAMILY HISTORY: Kidney Failure - Father Kidney Stones - Father   SOCIAL HISTORY: Marital Status: Single Current Smoking Status: Patient has never smoked.   Tobacco Use Assessment Completed: Used Tobacco in last 30 days? Drinks 1 caffeinated drink per day.    REVIEW OF SYSTEMS:    GU Review Male:   Patient denies frequent urination, hard to postpone urination, burning/ pain with urination, get up at night to urinate, leakage of urine, stream starts and stops, trouble starting your stream, have to strain to urinate ,  erection problems, and penile pain.  Gastrointestinal (Upper):   Patient denies nausea, vomiting, and indigestion/ heartburn.  Gastrointestinal (Lower):   Patient denies diarrhea and constipation.  Constitutional:   Patient denies night sweats, fever, weight loss, and fatigue.  Skin:   Patient denies skin rash/ lesion and itching.  Eyes:   Patient denies blurred  vision and double vision.  Ears/ Nose/ Throat:   Patient denies sore throat and sinus problems.  Hematologic/Lymphatic:   Patient denies swollen glands and easy bruising.  Cardiovascular:   Patient denies leg swelling and chest pains.  Respiratory:   Patient denies cough and shortness of breath.  Endocrine:   Patient denies excessive thirst.  Musculoskeletal:   Patient denies back pain and joint pain.  Neurological:   Patient denies headaches and dizziness.  Psychologic:   Patient denies depression and anxiety.   VITAL SIGNS: None   Complexity of Data:  Source Of History:  Patient  Records Review:   Previous Patient Records  Urodynamics Review:   Review Urodynamics Tests   04/28/20  PSA  Total PSA 4.60 ng/mL    PROCEDURES:         Flexible Cystoscopy - 52000  Risks, benefits, and some of the potential complications of the procedure were discussed at length with the patient including infection, bleeding, voiding discomfort, urinary retention, fever, chills, sepsis, and others. All questions were answered. Informed consent was obtained. Antibiotic prophylaxis was given. Sterile technique and intraurethral analgesia were used.  Meatus:  Normal size. Normal location. Normal condition.  Urethra:  No strictures.  External Sphincter:  Normal.  Verumontanum:  Normal.  Prostate:  Obstructing. Moderate hyperplasia. relatively short prostate, about 3 cm. No obvious big median lobe  Bladder Neck:  Non-obstructing.  Ureteral Orifices:  Normal location. Normal size. Normal shape.   Bladder:  No trabeculation. No tumors. Normal mucosa. No stones.      The lower urinary tract was carefully examined. The procedure was well-tolerated and without complications. Antibiotic instructions were given. Instructions were given to call the office immediately for bloody urine, difficulty urinating, urinary retention, painful or frequent urination, fever, chills, nausea, vomiting or other illness. The patient  stated that he understood these instructions and would comply with them.        Simple Foley Catheterization - [id number]  A 16 French Foley catheter was inserted into the bladder using sterile technique. The patient was taught routine catheter care.   ASSESSMENT:      ICD-10 Details  1 GU:   BPH w/LUTS - N40.1 Chronic, Stable  2   Urinary Retention - R33.8 Chronic, Stable     PLAN:           Schedule Procedure: Unspecified Date - Cystoscopy TURP - 67893          Document Letter(s):  Created for Patient: Clinical Summary         Notes:   The patient has failed medical management for his lower urinary tract symptoms. He would like to proceed with surgical resection. I discussed bipolar transurethral resection of the prostate. I specifically discussed the risks including but not limited to bleeding which could require blood transfusion, infection, and injury to surrounding structures. Also discussed the possibility that the surgery would not improve symptoms though most men have a great improvement in their symptoms. Also discussed the low likelihood but possibility of development of new symptoms such as irritative voiding symptoms or urinary incontinence. Most men will have some degree of urinary urgency  and discomfort immediately following the surgery that resolves in a short amount of time. He understands that most often this is an outpatient procedure but occasionally patients require hospitalization for continuous bladder irrigation in the event of excess bleeding. He also understands the possibility of being sent home with a urethral catheter. The patient and his healthcare power of attorney expressed understanding and is eager to proceed. also specifically discussed the possibility that this may not be effective for him. He may still end up needing a catheter long-term or transition to suprapubic tube. They expressed understanding   Cc: Dr. [name]    Signed by Modena Slater, III, M.D. on  06/22/20 at 2:53 PM (EDT

## 2020-07-04 NOTE — Transfer of Care (Signed)
Immediate Anesthesia Transfer of Care Note  Patient: Jeffery Fox  Procedure(s) Performed: TRANSURETHRAL RESECTION OF THE PROSTATE (TURP) (N/A Prostate)  Patient Location: PACU  Anesthesia Type:General  Level of Consciousness: awake and alert   Airway & Oxygen Therapy: Patient Spontanous Breathing and Patient connected to face mask  Post-op Assessment: Report given to RN and Post -op Vital signs reviewed and stable  Post vital signs: Reviewed and stable  Last Vitals:  Vitals Value Taken Time  BP 136/80 07/04/20 0904  Temp    Pulse 76 07/04/20 0906  Resp 14 07/04/20 0906  SpO2 100 % 07/04/20 0906  Vitals shown include unvalidated device data.  Last Pain:  Vitals:   07/04/20 0634  TempSrc:   PainSc: 0-No pain         Complications: No complications documented.

## 2020-07-05 ENCOUNTER — Encounter (HOSPITAL_COMMUNITY): Payer: Self-pay | Admitting: Urology

## 2020-07-05 DIAGNOSIS — R338 Other retention of urine: Secondary | ICD-10-CM | POA: Diagnosis not present

## 2020-07-05 DIAGNOSIS — R972 Elevated prostate specific antigen [PSA]: Secondary | ICD-10-CM | POA: Diagnosis not present

## 2020-07-05 DIAGNOSIS — N401 Enlarged prostate with lower urinary tract symptoms: Secondary | ICD-10-CM | POA: Diagnosis not present

## 2020-07-05 LAB — SURGICAL PATHOLOGY

## 2020-07-05 MED ORDER — CHLORHEXIDINE GLUCONATE CLOTH 2 % EX PADS
6.0000 | MEDICATED_PAD | Freq: Every day | CUTANEOUS | Status: DC
  Administered 2020-07-05: 6 via TOPICAL

## 2020-07-05 NOTE — Discharge Summary (Signed)
Physician Discharge Summary  Patient ID: Jeffery Fox MRN: [id number] DOB/AGE: May 24, 1950 70 y.o.  Admit date: 07/04/2020 Discharge date: 07/05/2020  Admission Diagnoses:  Discharge Diagnoses:  Active Problems:   BPH (benign prostatic hyperplasia)   Discharged Condition: good  Hospital Course: Patient underwent a TURP.  He remained overnight on observation.  CBI was weaned.  Urine was light pink off CBI.  He was discharged with his Foley catheter.  Consults: None  Significant Diagnostic Studies: None  Treatments: surgery: TURP  Discharge Exam: Blood pressure 117/71, pulse 77, temperature 98.3 F (36.8 C), temperature source Oral, resp. rate 20, height 5\' 2"  (1.575 m), weight 52.2 kg, SpO2 97 %. General appearance: alert no acute distress Abdomen soft, nontender, nondistended Patient is uncircumcised.  His foreskin was retracted.  I returned it to its proper anatomical position.  Three-way Foley catheter in place CBI clamped with pink urine  Disposition: Discharge disposition: 01-Home or Self Care       Discharge Instructions    No wound care   Complete by: As directed      Allergies as of 07/05/2020      Reactions   Sulfa Antibiotics Rash      Medication List    STOP taking these medications   meloxicam 15 MG tablet Commonly known as: MOBIC   tamsulosin 0.4 MG Caps capsule Commonly known as: FLOMAX     TAKE these medications   acetaminophen 500 MG tablet Commonly known as: TYLENOL Take 1,000 mg by mouth in the morning and at bedtime.   BENEFIBER PO Take 1 Scoop by mouth daily.   busPIRone 7.5 MG tablet Commonly known as: BUSPAR Take 7.5 mg by mouth at bedtime.   Cartia XT 120 MG 24 hr capsule Generic drug: diltiazem Take 120 mg by mouth daily.   cetirizine 10 MG tablet Commonly known as: ZYRTEC Take 10 mg by mouth daily.   diclofenac Sodium 1 % Gel Commonly known as: VOLTAREN Apply 2 g topically daily as needed (lower back pain).    dorzolamide-timolol 22.3-6.8 MG/ML ophthalmic solution Commonly known as: COSOPT Place 1 drop into the right eye daily.   escitalopram 10 MG tablet Commonly known as: LEXAPRO Take 10 mg by mouth every evening.   famotidine 20 MG tablet Commonly known as: PEPCID Take 20 mg by mouth daily.   GLUCOSAMINE CHONDR 1500 COMPLX PO Take 1,500 mg by mouth daily.   HYDROcodone-acetaminophen 5-325 MG tablet Commonly known as: NORCO/VICODIN Take 1 tablet by mouth daily as needed for moderate pain.   hydrOXYzine 25 MG tablet Commonly known as: ATARAX/VISTARIL Take 25 mg by mouth every 8 (eight) hours as needed for itching.   melatonin 3 MG Tabs tablet Take 3 mg by mouth at bedtime.   multivitamin with minerals Tabs tablet Take 1 tablet by mouth daily.   olopatadine 0.1 % ophthalmic solution Commonly known as: PATANOL Place 1 drop into both eyes daily.   Probiotic-10 Ultimate Caps Take 170 mg by mouth daily.   risperiDONE 2 MG tablet Commonly known as: RISPERDAL Take 2 mg by mouth at bedtime.        Signed: 07/07/2020, III 07/05/2020, 7:50 AM

## 2020-07-25 DIAGNOSIS — H44513 Absolute glaucoma, bilateral: Secondary | ICD-10-CM | POA: Diagnosis not present

## 2020-08-01 DIAGNOSIS — R350 Frequency of micturition: Secondary | ICD-10-CM | POA: Diagnosis not present

## 2020-08-15 DIAGNOSIS — Z20822 Contact with and (suspected) exposure to covid-19: Secondary | ICD-10-CM | POA: Diagnosis not present

## 2020-08-17 DIAGNOSIS — R338 Other retention of urine: Secondary | ICD-10-CM | POA: Diagnosis not present

## 2020-09-13 DIAGNOSIS — Z20822 Contact with and (suspected) exposure to covid-19: Secondary | ICD-10-CM | POA: Diagnosis not present

## 2020-09-19 DIAGNOSIS — R350 Frequency of micturition: Secondary | ICD-10-CM | POA: Diagnosis not present

## 2020-10-19 ENCOUNTER — Ambulatory Visit (INDEPENDENT_AMBULATORY_CARE_PROVIDER_SITE_OTHER): Payer: Medicare Other | Admitting: Otolaryngology

## 2020-10-19 ENCOUNTER — Other Ambulatory Visit: Payer: Self-pay

## 2020-10-19 DIAGNOSIS — H6123 Impacted cerumen, bilateral: Secondary | ICD-10-CM | POA: Diagnosis not present

## 2020-10-19 DIAGNOSIS — H903 Sensorineural hearing loss, bilateral: Secondary | ICD-10-CM

## 2020-10-19 NOTE — Progress Notes (Signed)
HPI: Jeffery Fox is a 70 y.o. male who presents for evaluation of wax removal from his ears.  He wears bilateral hearing aids..  Past Medical History:  Diagnosis Date   Arthritis    Depression    Hypertension    Stroke [facility])    eye   Past Surgical History:  Procedure Laterality Date   NO PAST SURGERIES     TRANSURETHRAL RESECTION OF PROSTATE N/A 07/04/2020   Procedure: TRANSURETHRAL RESECTION OF THE PROSTATE (TURP);  Surgeon: Crista Elliot, MD;  Location: [facility] ORS;  Service: Urology;  Laterality: N/A;   Social History   Socioeconomic History   Marital status: Single    Spouse name: Not on file   Number of children: Not on file   Years of education: Not on file   Highest education level: Not on file  Occupational History   Not on file  Tobacco Use   Smoking status: Never   Smokeless tobacco: Never  Vaping Use   Vaping Use: Never used  Substance and Sexual Activity   Alcohol use: Never   Drug use: Never   Sexual activity: Not on file  Other Topics Concern   Not on file  Social History Narrative   Not on file   Social Determinants of Health   Financial Resource Strain: Not on file  Food Insecurity: Not on file  Transportation Needs: Not on file  Physical Activity: Not on file  Stress: Not on file  Social Connections: Not on file   No family history on file. Allergies  Allergen Reactions   Sulfa Antibiotics Rash   Prior to Admission medications   Medication Sig Start Date End Date Taking? Authorizing Provider  acetaminophen (TYLENOL) 500 MG tablet Take 1,000 mg by mouth in the morning and at bedtime.    [provider]  busPIRone (BUSPAR) 7.5 MG tablet Take 7.5 mg by mouth at bedtime. 06/22/20   [provider]  CARTIA XT 120 MG 24 hr capsule Take 120 mg by mouth daily. 09/24/19   [provider]  cetirizine (ZYRTEC) 10 MG tablet Take 10 mg by mouth daily.    [provider]  diclofenac Sodium (VOLTAREN) 1 % GEL Apply 2 g  topically daily as needed (lower back pain).    [provider]  dorzolamide-timolol (COSOPT) 22.3-6.8 MG/ML ophthalmic solution Place 1 drop into the right eye daily.    [provider]  escitalopram (LEXAPRO) 10 MG tablet Take 10 mg by mouth every evening.    [provider]  famotidine (PEPCID) 20 MG tablet Take 20 mg by mouth daily.    [provider]  Glucosamine-Chondroit-Vit C-Mn (GLUCOSAMINE CHONDR 1500 COMPLX PO) Take 1,500 mg by mouth daily.    [provider]  HYDROcodone-acetaminophen (NORCO/VICODIN) 5-325 MG tablet Take 1 tablet by mouth daily as needed for moderate pain.    [provider]  hydrOXYzine (ATARAX/VISTARIL) 25 MG tablet Take 25 mg by mouth every 8 (eight) hours as needed for itching.    [provider]  melatonin 3 MG TABS tablet Take 3 mg by mouth at bedtime.    [provider]  Multiple Vitamin (MULTIVITAMIN WITH MINERALS) TABS tablet Take 1 tablet by mouth daily.    [provider]  olopatadine (PATANOL) 0.1 % ophthalmic solution Place 1 drop into both eyes daily.    [provider]  Probiotic Product (PROBIOTIC-10 ULTIMATE) CAPS Take 170 mg by mouth daily.    [provider]  risperiDONE (RISPERDAL) 2 MG tablet Take 2 mg by mouth at bedtime.    [provider]  Wheat Dextrin (BENEFIBER PO) Take 1 Scoop by mouth daily.    [provider]     Positive ROS: Otherwise negative  All other systems have been reviewed and were otherwise negative with the exception of those mentioned in the HPI and as above.  Physical Exam: Constitutional: Alert, well-appearing, no acute distress Ears: External ears without lesions or tenderness. Ear canals were cleaned with suction and curettes.  In addition to a large amount of wax he had several hearing aid filters in the left ear canal that were removed.  TMs were clear bilaterally.. Nasal: External nose without  lesions. Clear nasal passages Oral: Oropharynx clear. Neck: No palpable adenopathy or masses Respiratory: Breathing comfortably  Skin: No facial/neck lesions or rash noted.  Cerumen impaction removal  Date/Time: 10/19/2020 12:00 PM Performed by: Drema Halon, MD Authorized by: Drema Halon, MD   Consent:    Consent obtained:  Verbal   Consent given by:  Patient   Risks discussed:  Pain and bleeding Procedure details:    Location:  L ear and R ear   Procedure type: curette and suction   Post-procedure details:    Inspection:  TM intact and canal normal   Hearing quality:  Improved   Procedure completion:  Tolerated well, no immediate complications Comments:     In addition to a large amount of wax he had several I think 6 hearing aid filters within the left ear canal that were removed.  TMs were clear bilaterally.  Assessment: Bilateral cerumen impactions in patient who wears bilateral hearing aids.  Plan: He will follow-up as needed.  Jeffery Bonds, MD

## 2020-11-10 ENCOUNTER — Other Ambulatory Visit: Payer: Self-pay

## 2020-11-15 ENCOUNTER — Other Ambulatory Visit: Payer: Self-pay | Admitting: *Deleted

## 2020-11-15 NOTE — Patient Outreach (Signed)
[facility]) Care Management  11/15/2020  Jeffery Fox 11-Feb-1951 [id number]  Telephone outreach for eligible [facility] Care Management services. No answer, left a message to return my call. Will call another day this week.  Zara Council. Burgess Estelle, MSN, [facility] Gerontological Nurse Practitioner [facility] Care Management [phone]

## 2020-11-16 ENCOUNTER — Other Ambulatory Visit: Payer: Self-pay | Admitting: *Deleted

## 2020-11-16 ENCOUNTER — Encounter: Payer: Self-pay | Admitting: *Deleted

## 2020-11-16 DIAGNOSIS — I10 Essential (primary) hypertension: Secondary | ICD-10-CM

## 2020-11-16 DIAGNOSIS — H4053X3 Glaucoma secondary to other eye disorders, bilateral, severe stage: Secondary | ICD-10-CM

## 2020-11-16 DIAGNOSIS — F79 Unspecified intellectual disabilities: Secondary | ICD-10-CM | POA: Insufficient documentation

## 2020-11-16 DIAGNOSIS — F320 Major depressive disorder, single episode, mild: Secondary | ICD-10-CM

## 2020-11-16 DIAGNOSIS — F4323 Adjustment disorder with mixed anxiety and depressed mood: Secondary | ICD-10-CM | POA: Insufficient documentation

## 2020-11-16 DIAGNOSIS — F32A Depression, unspecified: Secondary | ICD-10-CM | POA: Insufficient documentation

## 2020-11-16 DIAGNOSIS — I639 Cerebral infarction, unspecified: Secondary | ICD-10-CM | POA: Insufficient documentation
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
## 2020-11-17 DIAGNOSIS — N401 Enlarged prostate with lower urinary tract symptoms: Secondary | ICD-10-CM | POA: Diagnosis not present

## 2020-11-17 DIAGNOSIS — R3914 Feeling of incomplete bladder emptying: Secondary | ICD-10-CM | POA: Diagnosis not present

## 2020-11-20 DIAGNOSIS — Z79891 Long term (current) use of opiate analgesic: Secondary | ICD-10-CM | POA: Diagnosis not present

## 2020-11-20 DIAGNOSIS — K279 Peptic ulcer, site unspecified, unspecified as acute or chronic, without hemorrhage or perforation: Secondary | ICD-10-CM | POA: Diagnosis not present

## 2020-11-20 DIAGNOSIS — M7712 Lateral epicondylitis, left elbow: Secondary | ICD-10-CM | POA: Diagnosis not present

## 2020-11-20 DIAGNOSIS — M4125 Other idiopathic scoliosis, thoracolumbar region: Secondary | ICD-10-CM | POA: Diagnosis not present

## 2020-11-20 DIAGNOSIS — Z993 Dependence on wheelchair: Secondary | ICD-10-CM | POA: Diagnosis not present

## 2020-11-20 DIAGNOSIS — Z9181 History of falling: Secondary | ICD-10-CM | POA: Diagnosis not present

## 2020-11-20 DIAGNOSIS — H5461 Unqualified visual loss, right eye, normal vision left eye: Secondary | ICD-10-CM | POA: Diagnosis not present

## 2020-11-20 DIAGNOSIS — K449 Diaphragmatic hernia without obstruction or gangrene: Secondary | ICD-10-CM | POA: Diagnosis not present

## 2020-11-20 DIAGNOSIS — F03911 Unspecified dementia, unspecified severity, with agitation: Secondary | ICD-10-CM | POA: Diagnosis not present

## 2020-11-20 DIAGNOSIS — F0393 Unspecified dementia, unspecified severity, with mood disturbance: Secondary | ICD-10-CM | POA: Diagnosis not present

## 2020-11-20 DIAGNOSIS — I10 Essential (primary) hypertension: Secondary | ICD-10-CM | POA: Diagnosis not present

## 2020-11-20 DIAGNOSIS — N400 Enlarged prostate without lower urinary tract symptoms: Secondary | ICD-10-CM | POA: Diagnosis not present

## 2020-11-20 DIAGNOSIS — H9190 Unspecified hearing loss, unspecified ear: Secondary | ICD-10-CM | POA: Diagnosis not present

## 2020-11-20 DIAGNOSIS — F79 Unspecified intellectual disabilities: Secondary | ICD-10-CM | POA: Diagnosis not present

## 2020-11-20 DIAGNOSIS — G8929 Other chronic pain: Secondary | ICD-10-CM | POA: Diagnosis not present

## 2020-11-23 ENCOUNTER — Other Ambulatory Visit: Payer: Self-pay | Admitting: *Deleted

## 2020-11-23 ENCOUNTER — Other Ambulatory Visit: Payer: Self-pay

## 2020-11-23 VITALS — BP 110/60 | HR 90 | Resp 18

## 2020-11-23 DIAGNOSIS — I10 Essential (primary) hypertension: Secondary | ICD-10-CM | POA: Diagnosis not present

## 2020-11-23 DIAGNOSIS — M7712 Lateral epicondylitis, left elbow: Secondary | ICD-10-CM | POA: Diagnosis not present

## 2020-11-23 DIAGNOSIS — K449 Diaphragmatic hernia without obstruction or gangrene: Secondary | ICD-10-CM | POA: Diagnosis not present

## 2020-11-23 DIAGNOSIS — K279 Peptic ulcer, site unspecified, unspecified as acute or chronic, without hemorrhage or perforation: Secondary | ICD-10-CM | POA: Diagnosis not present

## 2020-11-23 DIAGNOSIS — M4125 Other idiopathic scoliosis, thoracolumbar region: Secondary | ICD-10-CM | POA: Diagnosis not present

## 2020-11-23 DIAGNOSIS — Z23 Encounter for immunization: Secondary | ICD-10-CM

## 2020-11-23 DIAGNOSIS — G8929 Other chronic pain: Secondary | ICD-10-CM | POA: Diagnosis not present

## 2020-11-23 NOTE — Patient Outreach (Addendum)
[facility]) Care Management  [facility] Care Manager  11/23/2020   Jeffery Fox Mar 25, 1950 [id number]  Subjective: Home visit to meet new pt's with focus on Jeffery Fox.  Patient Active Problem List   Diagnosis Date Noted   Glaucoma associated with ocular disorder, severe stage, bilateral 11/16/2020   Depression 11/16/2020   Adjustment disorder with mixed anxiety and depressed mood 11/16/2020   Mentally disabled 11/16/2020   HTN (hypertension) 11/16/2020   CVA (cerebral vascular accident) (HCC) 11/16/2020   BPH (benign prostatic hyperplasia) 07/04/2020   Objective: BP 110/60 (BP Location: Right Arm, Patient Position: Sitting, Cuff Size: Normal)   Pulse 90   Resp 18   SpO2 96%  Verbal but not oriented pt has intelligible disability Cooperative with exam. Able to answer yes and no and follows commands Heart RRR Lungs clear Abd BS+ all quads, soft, nontender  Encounter Medications:  Outpatient Encounter Medications as of 11/23/2020  Medication Sig Note   acetaminophen (TYLENOL) 500 MG tablet Take 1,000 mg by mouth in the morning and at bedtime. 12/20/2019: 0800/2000   busPIRone (BUSPAR) 7.5 MG tablet Take 7.5 mg by mouth at bedtime.    CARTIA XT 120 MG 24 hr capsule Take 120 mg by mouth daily.    cetirizine (ZYRTEC) 10 MG tablet Take 10 mg by mouth daily.    diclofenac Sodium (VOLTAREN) 1 % GEL Apply 2 g topically daily as needed (lower back pain).    dorzolamide-timolol (COSOPT) 22.3-6.8 MG/ML ophthalmic solution Place 1 drop into the right eye daily.    escitalopram (LEXAPRO) 10 MG tablet Take 10 mg by mouth every evening.    famotidine (PEPCID) 20 MG tablet Take 20 mg by mouth daily.    Glucosamine-Chondroit-Vit C-Mn (GLUCOSAMINE CHONDR 1500 COMPLX PO) Take 1,500 mg by mouth daily.    HYDROcodone-acetaminophen (NORCO/VICODIN) 5-325 MG tablet Take 1 tablet by mouth daily as needed for moderate pain. 11/16/2020: Only prn at night per request for back pain.    hydrOXYzine (ATARAX/VISTARIL) 25 MG tablet Take 25 mg by mouth every 8 (eight) hours as needed for itching. 11/16/2020: Only takes in pm.   Multiple Vitamin (MULTIVITAMIN WITH MINERALS) TABS tablet Take 1 tablet by mouth daily.    Probiotic Product (PROBIOTIC-10 ULTIMATE) CAPS Take 170 mg by mouth daily.    risperiDONE (RISPERDAL) 2 MG tablet Take 2 mg by mouth at bedtime. 11/16/2020: MD has increased dose to two 2mg  tablets at hs.   No facility-administered encounter medications on file as of 11/23/2020.   Functional Status:  In your present state of health, do you have any difficulty performing the following activities: 11/16/2020 11/16/2020  Hearing? 01/16/2021  Vision? Y Y  Difficulty concentrating or making decisions? Jeffery Fox  Walking or climbing stairs? Y Y  Dressing or bathing? Y Y  Doing errands, shopping? Jeffery Fox  Preparing Food and eating ? Y Y  Using the Toilet? Y Y  In the past six months, have you accidently leaked urine? N N  Do you have problems with loss of bowel control? N N  Managing your Medications? Y Y  Managing your Finances? Jeffery Fox  Housekeeping or managing your Housekeeping? Y Y  Some recent data might be hidden    Fall/Depression Screening: Fall Risk  11/16/2020 11/16/2020  Falls in the past year? 1 1  Number falls in past yr: 1 1  Injury with Fall? 1 1  Comment rug burns -  Risk for fall due to : History of fall(s);Impaired balance/gait;Impaired  mobility;Impaired vision History of fall(s)  Follow up Falls evaluation completed Falls evaluation completed   PHQ 2/9 Scores 11/16/2020  Exception Documentation Medical reason    Assessment: Intelligible disability                         High risk for accidents, falls                         Quality of life could be enhanced with music and daily movie or cartoon  Care Plan   Goals Addressed             This Visit's Progress    Patient to be assisted to have his annual health maintenace requirements done over the next 3  months.   On track    Start date: 11/16/20 HIGH Priority Long term goal Expected end date 02/11/21 Follow up 12/15/20 Barriers: Transportation cost prohibitive for some tasks that can be completed in the home.  Notes:  11/23/20 - Home visit to administer flu vaccine and assess pt face to face. Pt brother and his caregiver are present. Brother reports they are going to get the new COVID vaccine on Saturday. Dr. [name] voiced appreciation via his nurse, Selena Batten. 11/16/20 Pt must be transported by [facility] at a cost of $110 per trip. Some care could be provided at home with collaboration of MD for example vaccines could be given at home. Face to face visits with NP.      Prevent falls and other injuries as evidenced by brother's report each month over the next 3 months.   On track    Start date: 11/16/20 HIGH PRIORITY LONG TERM Expected end date 03/14/21 Follow up date 12/15/20 Barriers: Support System Notes: 11/23/20 Face to face meeting with pt, his brother and caregiver. Home is safe. Pt has hospital bed and stand up lift. He is getting PT now. Everyone is keenly aware of how high risk he is for accidents and they are very vigilant. 11/16/20 Pt is total care and depends on his caregivers to keep him safe and away from harm. Reviewed hx. No falls or injuries reported since pt came home from LTC stay last Spring. Has hospital bed, standing lift, WC. Caregivers for 13 hours in daytime and brother at night.     Quality of life will be maintained over the next 3 months per brother's report.   On track    Start date: 11/16/20 HIGH PRIORITY LONG TERM Expected end date 03/14/21 Follow up date: 12/15/20 Barriers: Knowledge Psychosocial Support System Notes: 10/12 Home visit. Pt has very nice home and loving brother, consistent caregivers. NP brought some CDs for pt to listen to but his brother said he had most of what I brought. Encouraged to play music and put on a movie everyday so he has some  stimulation. 11/16/20 Brother reports only a couple of activiites for diversion. NP to provide suggestions that caregivers can do to enhance his quality of life (music, reading to pt, massage, jokes)        Plan: Gave [place] 0.5 ml IM R Deltoid ([id number], exp:9/June/23) Follow-up: Patient agrees to Care Plan and Follow-up. Follow-up in 3 week(s)  Noralyn Pick C. Burgess Estelle, MSN, [facility] Gerontological Nurse Practitioner [facility] Care Management [phone]

## 2020-11-24 DIAGNOSIS — R350 Frequency of micturition: Secondary | ICD-10-CM | POA: Diagnosis not present

## 2020-11-29 DIAGNOSIS — G8929 Other chronic pain: Secondary | ICD-10-CM | POA: Diagnosis not present

## 2020-11-29 DIAGNOSIS — K449 Diaphragmatic hernia without obstruction or gangrene: Secondary | ICD-10-CM | POA: Diagnosis not present

## 2020-11-29 DIAGNOSIS — K279 Peptic ulcer, site unspecified, unspecified as acute or chronic, without hemorrhage or perforation: Secondary | ICD-10-CM | POA: Diagnosis not present

## 2020-11-29 DIAGNOSIS — M7712 Lateral epicondylitis, left elbow: Secondary | ICD-10-CM | POA: Diagnosis not present

## 2020-11-29 DIAGNOSIS — I10 Essential (primary) hypertension: Secondary | ICD-10-CM | POA: Diagnosis not present

## 2020-11-29 DIAGNOSIS — M4125 Other idiopathic scoliosis, thoracolumbar region: Secondary | ICD-10-CM | POA: Diagnosis not present

## 2020-12-01 DIAGNOSIS — M4125 Other idiopathic scoliosis, thoracolumbar region: Secondary | ICD-10-CM | POA: Diagnosis not present

## 2020-12-01 DIAGNOSIS — K449 Diaphragmatic hernia without obstruction or gangrene: Secondary | ICD-10-CM | POA: Diagnosis not present

## 2020-12-01 DIAGNOSIS — K279 Peptic ulcer, site unspecified, unspecified as acute or chronic, without hemorrhage or perforation: Secondary | ICD-10-CM | POA: Diagnosis not present

## 2020-12-01 DIAGNOSIS — I10 Essential (primary) hypertension: Secondary | ICD-10-CM | POA: Diagnosis not present

## 2020-12-01 DIAGNOSIS — G8929 Other chronic pain: Secondary | ICD-10-CM | POA: Diagnosis not present

## 2020-12-01 DIAGNOSIS — M7712 Lateral epicondylitis, left elbow: Secondary | ICD-10-CM | POA: Diagnosis not present

## 2020-12-05 DIAGNOSIS — K449 Diaphragmatic hernia without obstruction or gangrene: Secondary | ICD-10-CM | POA: Diagnosis not present

## 2020-12-05 DIAGNOSIS — M4125 Other idiopathic scoliosis, thoracolumbar region: Secondary | ICD-10-CM | POA: Diagnosis not present

## 2020-12-05 DIAGNOSIS — I10 Essential (primary) hypertension: Secondary | ICD-10-CM | POA: Diagnosis not present

## 2020-12-05 DIAGNOSIS — K279 Peptic ulcer, site unspecified, unspecified as acute or chronic, without hemorrhage or perforation: Secondary | ICD-10-CM | POA: Diagnosis not present

## 2020-12-05 DIAGNOSIS — U071 COVID-19: Secondary | ICD-10-CM | POA: Diagnosis not present

## 2020-12-05 DIAGNOSIS — G8929 Other chronic pain: Secondary | ICD-10-CM | POA: Diagnosis not present

## 2020-12-05 DIAGNOSIS — M7712 Lateral epicondylitis, left elbow: Secondary | ICD-10-CM | POA: Diagnosis not present

## 2020-12-07 DIAGNOSIS — M4125 Other idiopathic scoliosis, thoracolumbar region: Secondary | ICD-10-CM | POA: Diagnosis not present

## 2020-12-07 DIAGNOSIS — G8929 Other chronic pain: Secondary | ICD-10-CM | POA: Diagnosis not present

## 2020-12-07 DIAGNOSIS — K449 Diaphragmatic hernia without obstruction or gangrene: Secondary | ICD-10-CM | POA: Diagnosis not present

## 2020-12-07 DIAGNOSIS — I10 Essential (primary) hypertension: Secondary | ICD-10-CM | POA: Diagnosis not present

## 2020-12-07 DIAGNOSIS — K279 Peptic ulcer, site unspecified, unspecified as acute or chronic, without hemorrhage or perforation: Secondary | ICD-10-CM | POA: Diagnosis not present

## 2020-12-07 DIAGNOSIS — M7712 Lateral epicondylitis, left elbow: Secondary | ICD-10-CM | POA: Diagnosis not present

## 2020-12-08 DIAGNOSIS — H9190 Unspecified hearing loss, unspecified ear: Secondary | ICD-10-CM | POA: Diagnosis not present

## 2020-12-08 DIAGNOSIS — H547 Unspecified visual loss: Secondary | ICD-10-CM | POA: Diagnosis not present

## 2020-12-08 DIAGNOSIS — F79 Unspecified intellectual disabilities: Secondary | ICD-10-CM | POA: Diagnosis not present

## 2020-12-08 DIAGNOSIS — J069 Acute upper respiratory infection, unspecified: Secondary | ICD-10-CM | POA: Diagnosis not present

## 2020-12-08 DIAGNOSIS — R269 Unspecified abnormalities of gait and mobility: Secondary | ICD-10-CM | POA: Diagnosis not present

## 2020-12-08 DIAGNOSIS — M6281 Muscle weakness (generalized): Secondary | ICD-10-CM | POA: Diagnosis not present

## 2020-12-08 DIAGNOSIS — G8929 Other chronic pain: Secondary | ICD-10-CM | POA: Diagnosis not present

## 2020-12-08 DIAGNOSIS — M4125 Other idiopathic scoliosis, thoracolumbar region: Secondary | ICD-10-CM | POA: Diagnosis not present

## 2020-12-12 DIAGNOSIS — R350 Frequency of micturition: Secondary | ICD-10-CM | POA: Diagnosis not present

## 2020-12-14 DIAGNOSIS — M7712 Lateral epicondylitis, left elbow: Secondary | ICD-10-CM | POA: Diagnosis not present

## 2020-12-14 DIAGNOSIS — I10 Essential (primary) hypertension: Secondary | ICD-10-CM | POA: Diagnosis not present

## 2020-12-14 DIAGNOSIS — G8929 Other chronic pain: Secondary | ICD-10-CM | POA: Diagnosis not present

## 2020-12-14 DIAGNOSIS — K279 Peptic ulcer, site unspecified, unspecified as acute or chronic, without hemorrhage or perforation: Secondary | ICD-10-CM | POA: Diagnosis not present

## 2020-12-14 DIAGNOSIS — M4125 Other idiopathic scoliosis, thoracolumbar region: Secondary | ICD-10-CM | POA: Diagnosis not present

## 2020-12-14 DIAGNOSIS — K449 Diaphragmatic hernia without obstruction or gangrene: Secondary | ICD-10-CM | POA: Diagnosis not present

## 2020-12-15 ENCOUNTER — Other Ambulatory Visit: Payer: Self-pay | Admitting: *Deleted

## 2020-12-15 NOTE — Patient Outreach (Signed)
[facility]) Care Management  12/15/2020  Jeffery Fox 30-Oct-1950 [id number]  Telephone outreach to follow up on pt wellness.   Goals Addressed             This Visit's Progress    Patient to be assisted to have his annual health maintenace requirements done over the next 3 months.   On track    Start date: 11/16/20 HIGH Priority Long term goal Expected end date 02/11/21 Follow up 12/15/20 Barriers: Transportation cost prohibitive for some tasks that can be completed in the home.  Notes:  12/15/20  11/23/20 - Home visit to administer flu vaccine and assess pt face to face. Pt brother and his caregiver are present. Brother reports they are going to get the new COVID vaccine on Saturday. Dr. Felipa Fox voiced appreciration via his nurse, Selena Batten. 11/16/20 Pt must be transported by [facility] at a cost of $110 per trip. Some care could be provided at home with collaboration of MD for example vaccines could be given at home. Face to face visits with NP.      Prevent falls and other injuries as evidenced by brother's report each month over the next 3 months.   On track    Start date: 11/16/20 HIGH PRIORITY LONG TERM Expected end date 03/14/21 Follow up date 12/15/20 Barriers: Support System Notes: 12/15/20 No falls reported. Continue surveillance and safety practices. 11/23/20 Face to face meeting with pt, his brother and caregiver. Home is safe. Pt has hospital bed and stand up lift. He is getting PT now. Everyone is keenly aware of how high risk he is for accidents and they are very vigilant. 11/16/20 Pt is total care and depends on his caregivers to keep him safe and away from harm. Reviewed hx. No falls or injuries reported since pt came home from LTC stay last Spring. Has hospital bed, standing lift, WC. Caregivers for 13 hours in daytime and brother at night.     Quality of life will be maintained over the next 3 months per brother's report.   On track    Start date: 11/16/20 HIGH  PRIORITY LONG TERM Expected end date 03/14/21 Follow up date: 12/15/20 Barriers: Knowledge Psychosocial Support System Notes: 12/15/20 Brother is very attentive to pt needs. Pt has had some urinary incontinence issues that Jeffery Fox has previously discussed with NP this week. A urine sample was taken to urology, initial UA is negative, pending the culture. They are obtaining some new incontinence briefs. Encouraged regular toileting to avoid episodes. Encouraged vigilent skin care. 10/12 Home visit. Pt has very nice home and loving brother, consistent caregivers. NP brought some CDs for pt to listen to but his brother said he had most of what I brought. Encouraged to play music and put on a movie everyday so he has some stimulation. 11/16/20 Brother reports only a couple of activiites for diversion. NP to provide suggestions that caregivers can do to enhance his quality of life (music, reading to pt, massage, jokes)       We agreed to talk again in a month. Care plan was agreed on.  Zara Council. Burgess Estelle, MSN, [facility] Gerontological Nurse Practitioner [facility] Care Management [phone]

## 2020-12-20 DIAGNOSIS — Z79891 Long term (current) use of opiate analgesic: Secondary | ICD-10-CM | POA: Diagnosis not present

## 2020-12-20 DIAGNOSIS — M4125 Other idiopathic scoliosis, thoracolumbar region: Secondary | ICD-10-CM | POA: Diagnosis not present

## 2020-12-20 DIAGNOSIS — Z993 Dependence on wheelchair: Secondary | ICD-10-CM | POA: Diagnosis not present

## 2020-12-20 DIAGNOSIS — H5461 Unqualified visual loss, right eye, normal vision left eye: Secondary | ICD-10-CM | POA: Diagnosis not present

## 2020-12-20 DIAGNOSIS — H9190 Unspecified hearing loss, unspecified ear: Secondary | ICD-10-CM | POA: Diagnosis not present

## 2020-12-20 DIAGNOSIS — F0393 Unspecified dementia, unspecified severity, with mood disturbance: Secondary | ICD-10-CM | POA: Diagnosis not present

## 2020-12-20 DIAGNOSIS — F03911 Unspecified dementia, unspecified severity, with agitation: Secondary | ICD-10-CM | POA: Diagnosis not present

## 2020-12-20 DIAGNOSIS — F79 Unspecified intellectual disabilities: Secondary | ICD-10-CM | POA: Diagnosis not present

## 2020-12-20 DIAGNOSIS — N400 Enlarged prostate without lower urinary tract symptoms: Secondary | ICD-10-CM | POA: Diagnosis not present

## 2020-12-20 DIAGNOSIS — I10 Essential (primary) hypertension: Secondary | ICD-10-CM | POA: Diagnosis not present

## 2020-12-20 DIAGNOSIS — K449 Diaphragmatic hernia without obstruction or gangrene: Secondary | ICD-10-CM | POA: Diagnosis not present

## 2020-12-20 DIAGNOSIS — M7712 Lateral epicondylitis, left elbow: Secondary | ICD-10-CM | POA: Diagnosis not present

## 2020-12-20 DIAGNOSIS — Z9181 History of falling: Secondary | ICD-10-CM | POA: Diagnosis not present

## 2020-12-20 DIAGNOSIS — K279 Peptic ulcer, site unspecified, unspecified as acute or chronic, without hemorrhage or perforation: Secondary | ICD-10-CM | POA: Diagnosis not present

## 2020-12-20 DIAGNOSIS — G8929 Other chronic pain: Secondary | ICD-10-CM | POA: Diagnosis not present

## 2020-12-21 DIAGNOSIS — I10 Essential (primary) hypertension: Secondary | ICD-10-CM | POA: Diagnosis not present

## 2020-12-21 DIAGNOSIS — G8929 Other chronic pain: Secondary | ICD-10-CM | POA: Diagnosis not present

## 2020-12-21 DIAGNOSIS — M4125 Other idiopathic scoliosis, thoracolumbar region: Secondary | ICD-10-CM | POA: Diagnosis not present

## 2020-12-21 DIAGNOSIS — M7712 Lateral epicondylitis, left elbow: Secondary | ICD-10-CM | POA: Diagnosis not present

## 2020-12-21 DIAGNOSIS — K449 Diaphragmatic hernia without obstruction or gangrene: Secondary | ICD-10-CM | POA: Diagnosis not present

## 2020-12-21 DIAGNOSIS — K279 Peptic ulcer, site unspecified, unspecified as acute or chronic, without hemorrhage or perforation: Secondary | ICD-10-CM | POA: Diagnosis not present

## 2020-12-29 DIAGNOSIS — M7712 Lateral epicondylitis, left elbow: Secondary | ICD-10-CM | POA: Diagnosis not present

## 2020-12-29 DIAGNOSIS — G8929 Other chronic pain: Secondary | ICD-10-CM | POA: Diagnosis not present

## 2020-12-29 DIAGNOSIS — K279 Peptic ulcer, site unspecified, unspecified as acute or chronic, without hemorrhage or perforation: Secondary | ICD-10-CM | POA: Diagnosis not present

## 2020-12-29 DIAGNOSIS — M4125 Other idiopathic scoliosis, thoracolumbar region: Secondary | ICD-10-CM | POA: Diagnosis not present

## 2020-12-29 DIAGNOSIS — I10 Essential (primary) hypertension: Secondary | ICD-10-CM | POA: Diagnosis not present

## 2020-12-29 DIAGNOSIS — K449 Diaphragmatic hernia without obstruction or gangrene: Secondary | ICD-10-CM | POA: Diagnosis not present

## 2021-01-04 DIAGNOSIS — M4125 Other idiopathic scoliosis, thoracolumbar region: Secondary | ICD-10-CM | POA: Diagnosis not present

## 2021-01-04 DIAGNOSIS — K449 Diaphragmatic hernia without obstruction or gangrene: Secondary | ICD-10-CM | POA: Diagnosis not present

## 2021-01-04 DIAGNOSIS — M7712 Lateral epicondylitis, left elbow: Secondary | ICD-10-CM | POA: Diagnosis not present

## 2021-01-04 DIAGNOSIS — G8929 Other chronic pain: Secondary | ICD-10-CM | POA: Diagnosis not present

## 2021-01-04 DIAGNOSIS — I10 Essential (primary) hypertension: Secondary | ICD-10-CM | POA: Diagnosis not present

## 2021-01-04 DIAGNOSIS — K279 Peptic ulcer, site unspecified, unspecified as acute or chronic, without hemorrhage or perforation: Secondary | ICD-10-CM | POA: Diagnosis not present

## 2021-01-11 DIAGNOSIS — I10 Essential (primary) hypertension: Secondary | ICD-10-CM | POA: Diagnosis not present

## 2021-01-11 DIAGNOSIS — K279 Peptic ulcer, site unspecified, unspecified as acute or chronic, without hemorrhage or perforation: Secondary | ICD-10-CM | POA: Diagnosis not present

## 2021-01-11 DIAGNOSIS — K449 Diaphragmatic hernia without obstruction or gangrene: Secondary | ICD-10-CM | POA: Diagnosis not present

## 2021-01-11 DIAGNOSIS — M4125 Other idiopathic scoliosis, thoracolumbar region: Secondary | ICD-10-CM | POA: Diagnosis not present

## 2021-01-11 DIAGNOSIS — M7712 Lateral epicondylitis, left elbow: Secondary | ICD-10-CM | POA: Diagnosis not present

## 2021-01-11 DIAGNOSIS — G8929 Other chronic pain: Secondary | ICD-10-CM | POA: Diagnosis not present

## 2021-01-16 ENCOUNTER — Other Ambulatory Visit: Payer: Self-pay | Admitting: *Deleted

## 2021-01-16 DIAGNOSIS — Z20822 Contact with and (suspected) exposure to covid-19: Secondary | ICD-10-CM | POA: Diagnosis not present

## 2021-01-16 NOTE — Patient Outreach (Signed)
[place] [facility]) Care Management Geriatric Nurse Practitioner Note   01/16/2021 Name:  Jeffery Fox MRN:  [id number] DOB:  May 28, 1950  Summary: Jeffery Fox, reports Jeffery Fox is doing well. No acute problems or injuries.  Recommendations/Changes made from today's visit: Jeffery Fox to speak with PT about suggestions for possible new type of mattress or other devices to assist with bed mobility. PT will have last visit as pt has achieved his potential.   Subjective: Jeffery Fox is an 70 y.o. year old male who is a primary patient of Avva, Ravisankar, MD. The care management team was consulted for assistance with care management and/or care coordination needs.    Geriatric Nurse Practitioner completed Telephone Visit today.   Objective: BPs are usually all under 130/80  Medications Reviewed Today     Reviewed by Jeffery Lair, NP (Nurse Practitioner) on 11/16/20 at [place] List Status: <None>   Medication Order Taking? Sig Documenting Provider Last Dose Status Informant  acetaminophen (TYLENOL) 500 MG tablet [id number] Yes Take 1,000 mg by mouth in the morning and at bedtime. [provider] Taking Active Family Member           Med Note Jeffery Fox, Thea Gist Dec 20, 2019 11:37 AM) 0800/2000  busPIRone (BUSPAR) 7.5 MG tablet [id number] Yes Take 7.5 mg by mouth at bedtime. [provider] Taking Active Family Member  CARTIA XT 120 MG 24 hr capsule [id number] Yes Take 120 mg by mouth daily. [provider] Taking Active Family Member  cetirizine (ZYRTEC) 10 MG tablet [id number] Yes Take 10 mg by mouth daily. [provider] Taking Active Family Member  diclofenac Sodium (VOLTAREN) 1 % GEL [id number] Yes Apply 2 g topically daily as needed (lower back pain). [provider] Taking Active Family Member  dorzolamide-timolol (COSOPT) 22.3-6.8 MG/ML ophthalmic solution [id number] Yes Place 1 drop into the right eye daily. [provider] Taking Active Family Member  escitalopram (LEXAPRO) 10 MG tablet [id number] Yes Take 10 mg by mouth every evening. [provider] Taking Active Family Member  famotidine (PEPCID) 20 MG tablet [id number] Yes Take 20 mg by mouth daily. [provider] Taking Active Family Member  Glucosamine-Chondroit-Vit C-Mn (GLUCOSAMINE CHONDR 1500 COMPLX PO) [id number] Yes Take 1,500 mg by mouth daily. [provider] Taking Active Family Member  HYDROcodone-acetaminophen (NORCO/VICODIN) 5-325 MG tablet [id number] Yes Take 1 tablet by mouth daily as needed for moderate pain. [provider] Taking Active Family Member           Med Note Jeffery Fox, Jeffery Fox   Wed Nov 16, 2020 10:43 AM) Only prn at night per request for back pain.  hydrOXYzine (ATARAX/VISTARIL) 25 MG tablet [id number] Yes Take 25 mg by mouth every 8 (eight) hours as needed for itching. [provider] Taking Active Family Member           Med Note Jeffery Fox, Jeffery Fox   Wed Nov 16, 2020 10:44 AM) Only takes in pm.  Multiple Vitamin (MULTIVITAMIN WITH MINERALS) TABS tablet [id number] Yes Take 1 tablet by mouth daily. [provider] Taking Active Family Member  Probiotic Product (PROBIOTIC-10 ULTIMATE) CAPS [id number] Yes Take 170 mg by mouth daily. [provider] Taking Active Family Member  risperiDONE (RISPERDAL) 2 MG tablet [id number] Yes Take 2 mg by mouth at bedtime. [provider] Taking Active Family Member           Med Note Jeffery Fox   Wed Nov 16, 2020 10:47 AM) MD has  increased dose to two 46m tablets at hs.    Discontinued 11/16/20 1048 (No longer needed (for PRN medications))          Care Plan  Review of patient past medical history, allergies, medications, health status, including review of consultants reports, laboratory and other test data, was performed as part of comprehensive evaluation for care management services.   Care Plan : RN Care Manager  Plan of Care  Updates made by SDeloria Lair NP since 01/16/2021 12:00 AM     Problem: Therapeutic Alliance (General Plan of Care) Deleted 01/16/2021     Goal: Therapeutic Alliance Established Completed 11/23/2020  Start Date: 11/16/2020  Expected End Date: 12/15/2020  Priority: High  Note:   Evidence-based guidance:  Avoid value judgments; convey acceptance.  Encourage collaboration with the treatment team.  Establish rapport; develop trust relationship.  HTherapist, art  Provide emotional support; encourage patient to share feelings of anger, fear and anxiety.  Promote self-reliance and autonomy based on age and ability; discourage overprotection.  Use empathy and nonjudgmental, participatory manner.   Notes: 11/23/20 Met pt and caregiver brother today in their home established acquaintance and everyone agreed to work together. 11/16/20 Initiated care management today via brother, [name] NP also involved with him.    Problem: Coping Skills (General Plan of Care) Deleted 01/16/2021     Goal: Coping Skills Enhanced Completed 01/16/2021  Start Date: 11/16/2020  Expected End Date: 03/14/2021  Priority: Medium  Note:   Evidence-based guidance:  Acknowledge, normalize and validate difficulty of making life-long lifestyle changes.  Identify current effective and ineffective coping strategies.  Encourage patient and caregiver participation in care to increase self-esteem, confidence and feelings of control.  Consider alternative and complementary therapy approaches such as meditation, mindfulness or yoga.  Encourage participation in cognitive behavioral therapy to foster a positive identity, increase self-awareness, as well as bolster self-esteem, confidence and self-efficacy.  Discuss spirituality; be present as concerns are identified; encourage journaling, prayer, worship services, meditation or pastoral counseling.  Encourage participation in pleasurable group activities such as  hobbies, singing, sports or volunteering).  Encourage the use of mindfulness; refer for training or intensive intervention.  Consider the use of meditative movement therapy such as tai chi, yoga or qigong.  Promote a regular daily exercise program based on tolerance, ability and patient choice to support positive thinking about disease or aging.   Notes: 01/16/21 New activities are being done by daytime aids.  11/16/20 Identified that pt does not have many meaningful, activities. He enjoys going outdoors for walks in his walker. He listens to TV. Discussed experimenting with different types of music and assessing his response (nonverbal or verbal). Will suggest reading to him, telling him jokes.    Problem: Quality of Life (General Plan of Care) will be maintained per caregiver report on each encounter over the next year.   Priority: High     Long-Range Goal: Quality of Life Maintained Completed 01/16/2021  Start Date: 11/16/2020  Expected End Date: 03/14/2021  Priority: High  Note:   Notes: 01/13/21 QOL is maintained. New goal will be to continue into the next year. 11/16/20 Brother indicates he is safe and well cared for. Safety and fall prevention discussed. Diversional activities could be augmented. NP to send information. Encsure pt gets vaccinations.    Long-Range Goal: Patient will be have all of his basic needs met (food, shelter, safety, clothing, sleep) per brother report on each encournter over the next year.   Start Date: 01/16/2021  Expected End Date: 01/16/2022  Priority: High  Note:   Current Barriers:  Chronic Disease Management support and education needs related to ongoing home maintenance of a dependent family member Cognitive Deficits  RNCM Clinical Goal(s):  Patient will demonstrate ongoing health management independence as evidenced by Brother's report on each encounter over the next year.        continue to work with Consulting civil engineer and/or Social Worker to address care  management and care coordination needs related to HTN and Mental challenges as evidenced by adherence to CM Team Scheduled appointments     through collaboration with RN Care manager, provider, and care team.   Interventions: Inter-disciplinary care team collaboration (see longitudinal plan of care) Evaluation of current treatment plan related to  self management and patient's adherence to plan as established by provider  Patient Goals/Self-Care Activities: Take medications as prescribed   Attend all scheduled provider appointments Call provider office for new concerns or questions        Problem: Hyptertension   Priority: Medium  Onset Date: 01/16/2021     Long-Range Goal: Blood pressure will be <140/90 on 75% of readings as evidenced by caregiver report on each encounter.   Start Date: 01/16/2021  Expected End Date: 01/16/2022  Priority: Medium  Note:    Hypertension Interventions:  (Status:  New goal.) Long Term Goal Last practice recorded BP readings:  BP Readings from Last 3 Encounters:  11/23/20 110/60  07/05/20 128/76  06/30/20 126/70  Most recent eGFR/CrCl: No results found for: EGFR  No components found for: CRCL  Reviewed medications with patient and discussed importance of compliance Discussed plans with patient for ongoing care management follow up and provided patient with direct contact information for care management team Advised patient, providing education and rationale, to monitor blood pressure daily and record, calling PCP for findings outside established parameters Provided education on prescribed diet no added salt.       Plan: Telephone follow up appointment with care management team member scheduled for:  January 5th  Jeffery Fox C. Jeffery Neither, MSN, [facility] Gerontological Nurse Practitioner [facility] Care Management [phone]

## 2021-01-18 DIAGNOSIS — I10 Essential (primary) hypertension: Secondary | ICD-10-CM | POA: Diagnosis not present

## 2021-01-18 DIAGNOSIS — K449 Diaphragmatic hernia without obstruction or gangrene: Secondary | ICD-10-CM | POA: Diagnosis not present

## 2021-01-18 DIAGNOSIS — M4125 Other idiopathic scoliosis, thoracolumbar region: Secondary | ICD-10-CM | POA: Diagnosis not present

## 2021-01-18 DIAGNOSIS — K279 Peptic ulcer, site unspecified, unspecified as acute or chronic, without hemorrhage or perforation: Secondary | ICD-10-CM | POA: Diagnosis not present

## 2021-01-18 DIAGNOSIS — M7712 Lateral epicondylitis, left elbow: Secondary | ICD-10-CM | POA: Diagnosis not present

## 2021-01-18 DIAGNOSIS — G8929 Other chronic pain: Secondary | ICD-10-CM | POA: Diagnosis not present

## 2021-01-21 IMAGING — CT CT HEAD W/O CM
3 series · 14 of 47 positions shown, 16 images · non-contrast
Comparison: None

CLINICAL DATA: Fell a few days ago and again this morning, minor
head trauma, unwitnessed falls

EXAM:
CT HEAD WITHOUT CONTRAST
CT CERVICAL SPINE WITHOUT CONTRAST
TECHNIQUE: Multidetector CT imaging of the head and cervical spine was
performed following the standard protocol without intravenous
contrast. Multiplanar CT image reconstructions of the cervical spine
were also generated.

[Series 1: head wo · axial · 0.47mm/px · z∈[+798,+924]mm · 8 of 31 slices shown, 10 images]
[im 3/31  brain]
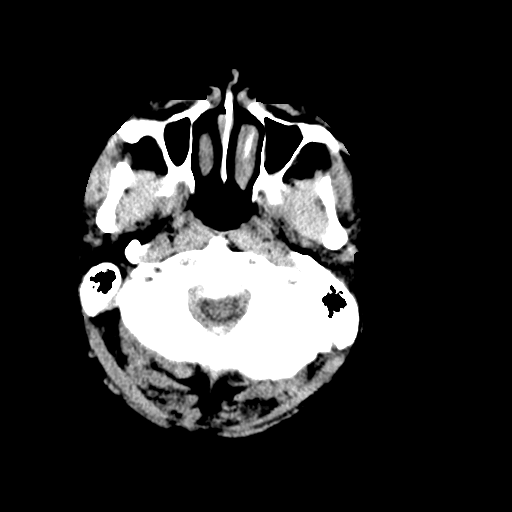
[im 3/31  bone]
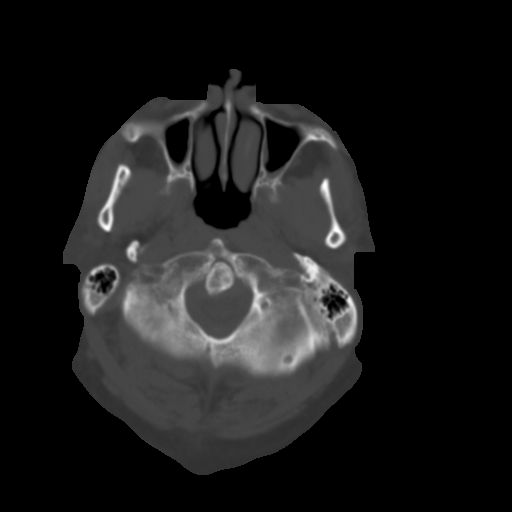
[im 7/31  brain]
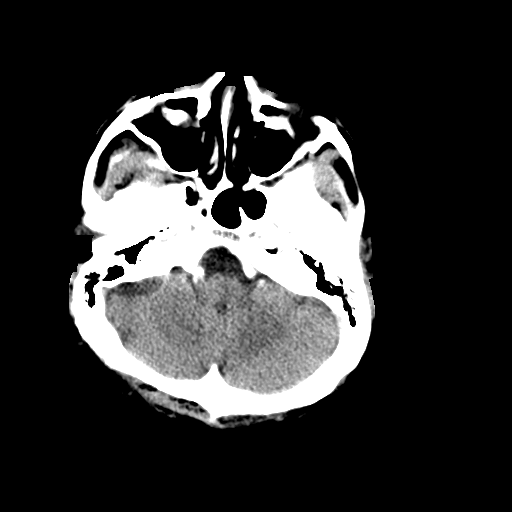
[im 10/31  brain]
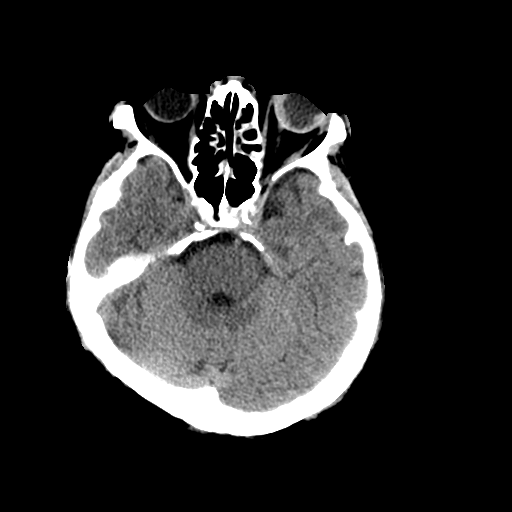
[im 14/31  brain]
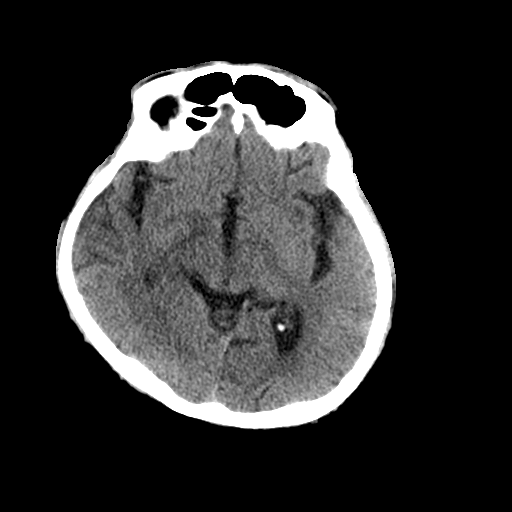
[im 17/31  brain]
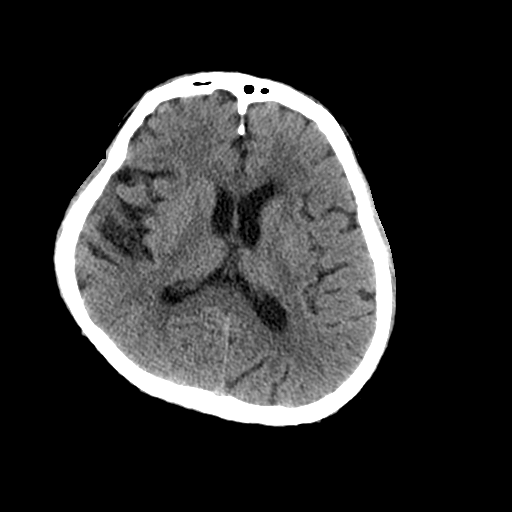
[im 17/31  bone]
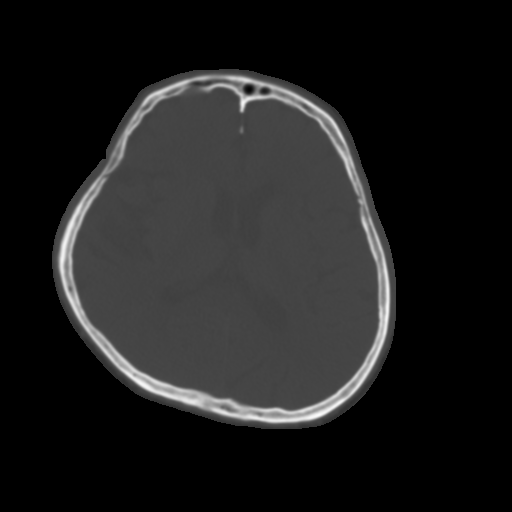
[im 21/31  brain]
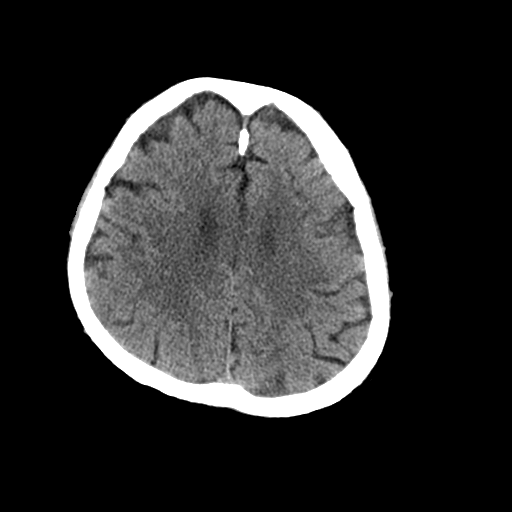
[im 24/31  brain]
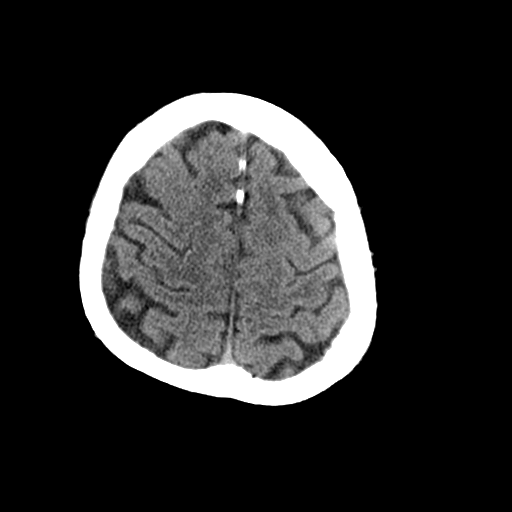
[im 28/31  brain]
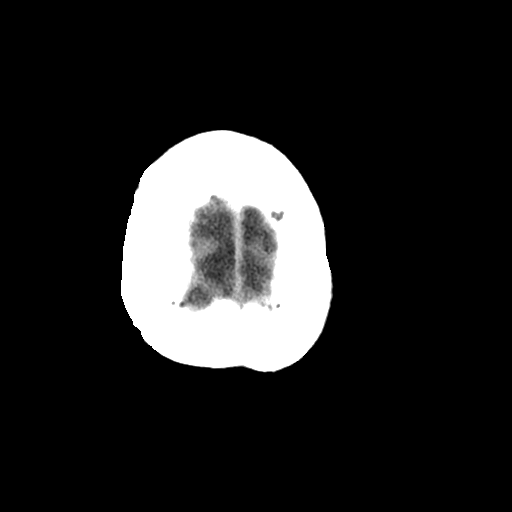

[Series 6: coronal soft tissue · coronal · 0.31mm/px · 3 of 71 slices shown]
[im 24/71  brain]
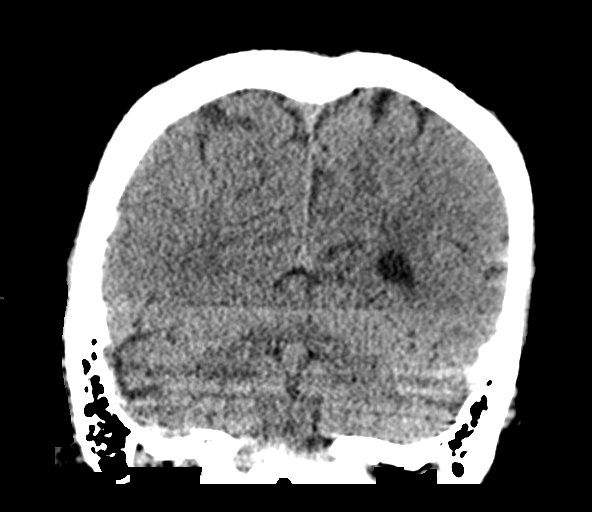
[im 32/71  brain]
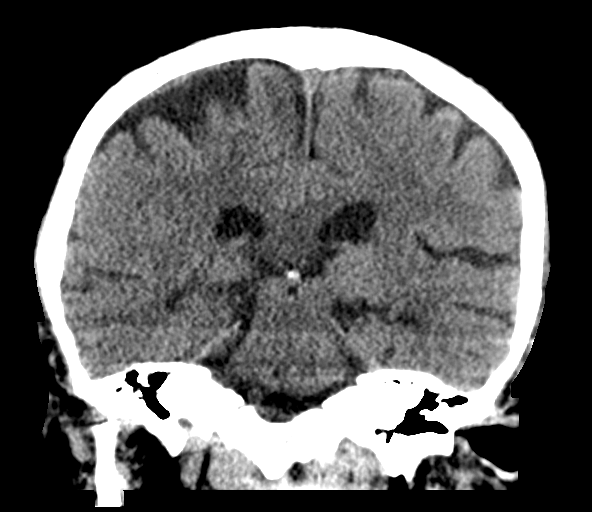
[im 39/71  brain]
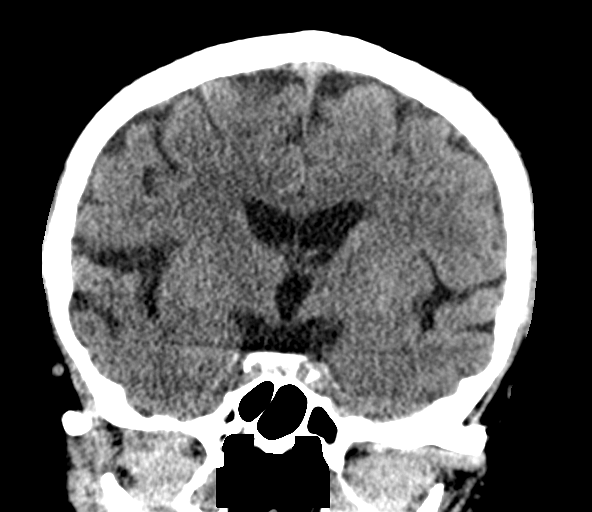

[Series 7: sagittal soft tissue · sagittal · 0.31mm/px · 3 of 62 slices shown]
[im 21/62  brain]
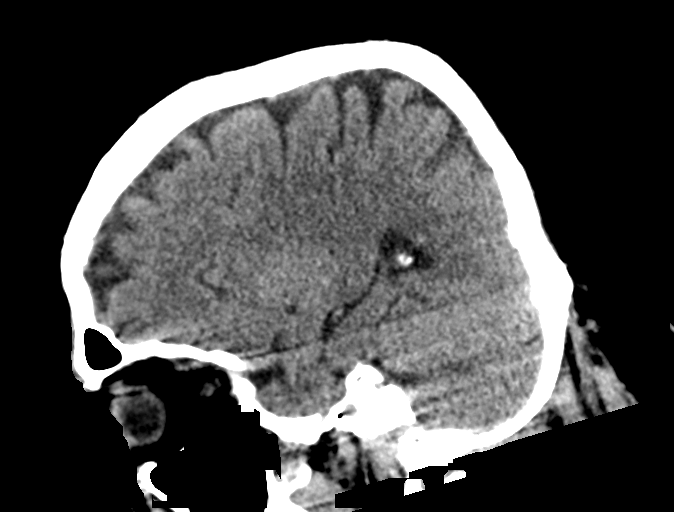
[im 31/62  brain]
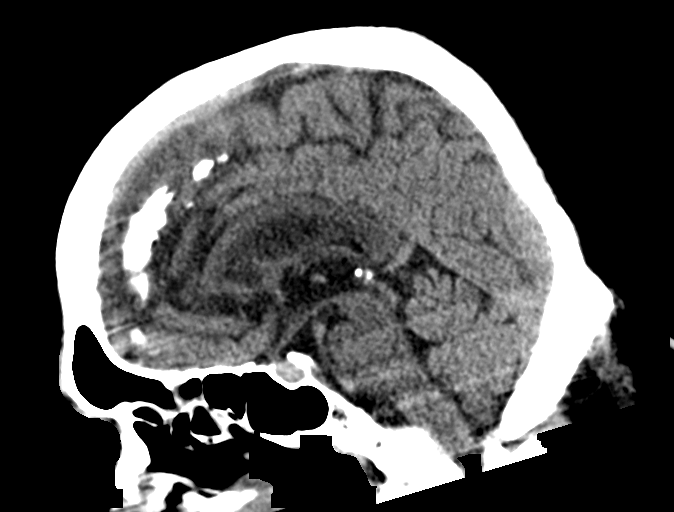
[im 41/62  brain]
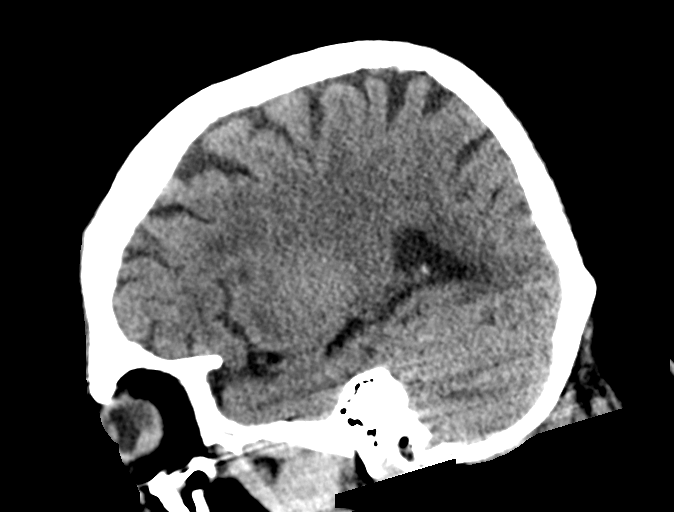

[14 of 47 positions shown; findings below may reference images not displayed]

FINDINGS: CT HEAD FINDINGS

Brain: Generalized atrophy. Normal ventricular morphology. No
midline shift or mass effect. Mild small vessel chronic ischemic
changes of deep cerebral white matter. No intracranial hemorrhage,
mass lesion, or evidence of acute infarction. No extra-axial fluid
collections.

Vascular: No hyperdense vessels. Mild atherosclerotic calcifications
of internal carotid arteries at skull base

Skull: Intact. Significant narrowing of the craniocervical
junction/foramen magnum, see below

Sinuses/Orbits: Mild scattered mucosal thickening paranasal sinuses.

Other: N/A

CT CERVICAL SPINE FINDINGS

Alignment: Anterolisthesis of C1 on C2. Superior subluxation of
odontoid process extending into foramen magnum. Additional
anterolisthesis C4-C5 approximately 4 mm. Mild retrolisthesis C6-C7.
5 mm anterolisthesis C7-T1.

Skull base and vertebrae: Osseous demineralization. Severe
multilevel facet degenerative changes. Significant C1-C2
degenerative changes. Severe multilevel degenerative disc disease
changes C4-C5 through T1-T2. Marked height loss of C7 vertebral
body. No acute fracture identified. Reversal of cervical lordosis.

Soft tissues and spinal canal: Prevertebral soft tissues normal
thickness. Marked AP narrowing of spinal canal at C1-C2,
approximately 8 mm residual diameter, may be compressing spinal
cord. Lesser degrees of spinal stenosis identified from C4-C5 to
C7-T1.

Disc levels:  Bulky endplate spurs at C5-C6 and C6-C7

Upper chest: Minimal biapical scarring

Other: N/A
IMPRESSION: Atrophy with mild small vessel chronic ischemic changes of deep
cerebral white matter.

No acute intracranial abnormalities.

Severe multilevel degenerative disc and facet disease changes of the
cervical spine as above.

Anterolisthesis of C1 on C2, with superior subluxation of odontoid
process extending into foramen magnum consistent with basilar
invagination.

Marked AP narrowing of spinal canal at C1-C2, approximately 8 mm
residual diameter, may be compressing spinal cord, with lesser
degrees of spinal stenosis identified at C4-C5 through C7-T1.

No acute cervical spine injury identified.

Findings called to Jazmin Sandlin PA on 12/20/2019 at 7770 hrs.

## 2021-02-16 ENCOUNTER — Other Ambulatory Visit: Payer: Self-pay | Admitting: *Deleted

## 2021-02-17 NOTE — Patient Outreach (Signed)
[place] [facility]) Care Management Geriatric Nurse Practitioner Note   02/17/2021 Name:  Jeffery Fox MRN:  [id number] DOB:  06-23-1950  Summary: Stable, no new issues. Overeating and excessive carb intake.  Recommendations/Changes made from today's visit: Discussed diet with his brother and will send info on carb counting. He is willing to learn and also would like to loose a few pounds himself.  Subjective: Jeffery Fox is an 71 y.o. year old male who is a primary patient of Avva, Ravisankar, MD. The care management team was consulted for assistance with care management and/or care coordination needs.    Geriatric Nurse Practitioner completed Telephone Visit today.   Objective: N/A  Outpatient Encounter Medications as of 02/16/2021  Medication Sig Note   acetaminophen (TYLENOL) 500 MG tablet Take 1,000 mg by mouth in the morning and at bedtime. 12/20/2019: 0800/2000   busPIRone (BUSPAR) 7.5 MG tablet Take 7.5 mg by mouth at bedtime.    CARTIA XT 120 MG 24 hr capsule Take 120 mg by mouth daily.    cetirizine (ZYRTEC) 10 MG tablet Take 10 mg by mouth daily.    diclofenac Sodium (VOLTAREN) 1 % GEL Apply 2 g topically daily as needed (lower back pain).    dorzolamide-timolol (COSOPT) 22.3-6.8 MG/ML ophthalmic solution Place 1 drop into the right eye daily.    escitalopram (LEXAPRO) 10 MG tablet Take 10 mg by mouth every evening.    famotidine (PEPCID) 20 MG tablet Take 20 mg by mouth daily.    Glucosamine-Chondroit-Vit C-Mn (GLUCOSAMINE CHONDR 1500 COMPLX PO) Take 1,500 mg by mouth daily.    HYDROcodone-acetaminophen (NORCO/VICODIN) 5-325 MG tablet Take 1 tablet by mouth daily as needed for moderate pain. 11/16/2020: Only prn at night per request for back pain.   hydrOXYzine (ATARAX/VISTARIL) 25 MG tablet Take 25 mg by mouth every 8 (eight) hours as needed for itching. 11/16/2020: Only takes in pm.   Multiple Vitamin (MULTIVITAMIN WITH MINERALS) TABS tablet Take 1 tablet by mouth  daily.    Probiotic Product (PROBIOTIC-10 ULTIMATE) CAPS Take 170 mg by mouth daily.    risperiDONE (RISPERDAL) 2 MG tablet Take 2 mg by mouth at bedtime. 11/16/2020: MD has increased dose to two 55m tablets at hs.   No facility-administered encounter medications on file as of 02/16/2021.   Care Plan  Review of patient past medical history, allergies, medications, health status, including review of consultants reports, laboratory and other test data, was performed as part of comprehensive evaluation for care management services.   Care Plan Update Quality of life: 02/16/21 - Spoke with brother, [name] who reports pt needs are being met by aid and by himself.  Current Barriers:  Chronic Disease Management support and education needs related to ongoing home maintenance of a dependent family member Cognitive Deficits  RNCM Clinical Goal(s):  Patient will demonstrate ongoing health management independence as evidenced by Brother's report on each encounter over the next year.        continue to work with RConsulting civil engineerand/or Social Worker to address care management and care coordination needs related to HTN and Mental challenges as evidenced by adherence to CM Team Scheduled appointments     through collaboration with RN Care manager, provider, and care team.   Interventions: Inter-disciplinary care team collaboration (see longitudinal plan of care) Evaluation of current treatment plan related to  self management and patient's adherence to plan as established by provider Discussed pt incontinence and toileting routine which is appropriate.  Patient Goals/Self-Care Activities:  Take medications as prescribed   Attend all scheduled provider appointments Call provider office for new concerns or questions   Care Plan : [place] of Care  Updates made by Deloria Lair, NP since 02/17/2021 12:00 AM     Problem: Hyptertension   Priority: Medium  Onset Date: 01/16/2021     Long-Range  Goal: Blood pressure will be <140/90 on 75% of readings as evidenced by caregiver report on each encounter.   Start Date: 01/16/2021  Expected End Date: 01/16/2022  This Visit's Progress: On track  Priority: Medium  Note:   02/15/21 Care Plan Update: Brother reports Jeffery Fox's BPs are mostly <140/90, he has had a couple of systolic readings 470-929, but >75% are in range! Advised to continue monitoring and advise MD or NP is has continual elevated readings.  Hypertension Interventions:  (Status:  New goal.) Long Term Goal Last practice recorded BP readings:  BP Readings from Last 3 Encounters:  11/23/20 110/60  07/05/20 128/76  06/30/20 126/70  Most recent eGFR/CrCl: No results found for: EGFR  No components found for: CRCL  Reviewed medications with patient and discussed importance of compliance Discussed plans with patient for ongoing care management follow up and provided patient with direct contact information for care management team Advised patient, providing education and rationale, to monitor blood pressure daily and record, calling PCP for findings outside established parameters Provided education on prescribed diet no added salt.       Plan: Telephone follow up appointment with care management team member scheduled for:  1 month.  Eulah Pont. Myrtie Neither, MSN, [facility] Gerontological Nurse Practitioner [facility] Care Management [phone]

## 2021-02-17 NOTE — Patient Instructions (Signed)
Visit Information  Thank you for taking time to visit with me today. Please don't hesitate to contact me if I can be of assistance to you before our next scheduled telephone appointment.  Following are the goals we discussed today:  (Copy and paste patient goals from clinical care plan here)  Our next appointment is by telephone on Monday, Feb 7 at 10:00  Following is a copy of your care plan:  Care Plan : [place] of Care  Updates made by Deloria Lair, NP since 02/17/2021 12:00 AM     Problem: Hyptertension   Priority: Medium  Onset Date: 01/16/2021     Long-Range Goal: Blood pressure will be <140/90 on 75% of readings as evidenced by caregiver report on each encounter.   Start Date: 01/16/2021  Expected End Date: 01/16/2022  This Visit's Progress: On track  Priority: Medium  Note:   02/15/21 Care Plan Update: Brother reports Jeffery Fox BPs are mostly <140/90, he has had a couple of systolic readings 264-158, but >75% are in range! Advised to continue monitoring and advise MD or NP is has continual elevated readings.  Hypertension Interventions:  (Status:  New goal.) Long Term Goal Last practice recorded BP readings:  BP Readings from Last 3 Encounters:  11/23/20 110/60  07/05/20 128/76  06/30/20 126/70   Most recent eGFR/CrCl: No results found for: EGFR  No components found for: CRCL  Reviewed medications with patient and discussed importance of compliance Discussed plans with patient for ongoing care management follow up and provided patient with direct contact information for care management team Advised patient, providing education and rationale, to monitor blood pressure daily and record, calling PCP for findings outside established parameters Provided education on prescribed diet no added salt.      Patient verbalizes understanding of instructions provided today and agrees to view in [place].   Telephone follow up appointment with care management team member  scheduled for: 03/21/21  Kayleen Memos C. Myrtie Neither, MSN, [facility] Gerontological Nurse Practitioner [facility] Care Management [phone]

## 2021-03-08 DIAGNOSIS — H44513 Absolute glaucoma, bilateral: Secondary | ICD-10-CM | POA: Diagnosis not present

## 2021-03-13 DIAGNOSIS — I10 Essential (primary) hypertension: Secondary | ICD-10-CM | POA: Diagnosis not present

## 2021-03-13 DIAGNOSIS — R7989 Other specified abnormal findings of blood chemistry: Secondary | ICD-10-CM | POA: Diagnosis not present

## 2021-03-13 DIAGNOSIS — Z125 Encounter for screening for malignant neoplasm of prostate: Secondary | ICD-10-CM | POA: Diagnosis not present

## 2021-03-20 ENCOUNTER — Other Ambulatory Visit: Payer: Self-pay | Admitting: *Deleted

## 2021-03-20 DIAGNOSIS — N400 Enlarged prostate without lower urinary tract symptoms: Secondary | ICD-10-CM | POA: Diagnosis not present

## 2021-03-20 DIAGNOSIS — G8929 Other chronic pain: Secondary | ICD-10-CM | POA: Diagnosis not present

## 2021-03-20 DIAGNOSIS — R269 Unspecified abnormalities of gait and mobility: Secondary | ICD-10-CM | POA: Diagnosis not present

## 2021-03-20 DIAGNOSIS — H547 Unspecified visual loss: Secondary | ICD-10-CM | POA: Diagnosis not present

## 2021-03-20 DIAGNOSIS — Z66 Do not resuscitate: Secondary | ICD-10-CM | POA: Diagnosis not present

## 2021-03-20 DIAGNOSIS — Z0000 Encounter for general adult medical examination without abnormal findings: Secondary | ICD-10-CM | POA: Diagnosis not present

## 2021-03-20 DIAGNOSIS — M6281 Muscle weakness (generalized): Secondary | ICD-10-CM | POA: Diagnosis not present

## 2021-03-20 DIAGNOSIS — M4125 Other idiopathic scoliosis, thoracolumbar region: Secondary | ICD-10-CM | POA: Diagnosis not present

## 2021-03-20 DIAGNOSIS — F79 Unspecified intellectual disabilities: Secondary | ICD-10-CM | POA: Diagnosis not present

## 2021-03-20 DIAGNOSIS — I10 Essential (primary) hypertension: Secondary | ICD-10-CM | POA: Diagnosis not present

## 2021-03-20 DIAGNOSIS — R82998 Other abnormal findings in urine: Secondary | ICD-10-CM | POA: Diagnosis not present

## 2021-03-20 NOTE — Patient Outreach (Signed)
[facility]) Care Management Geriatric Nurse Practitioner Note   03/20/2021 Name:  Jeffery Fox MRN:  [id number] DOB:  [birth year]  Summary: Patient is stable. Efforts to reduce carbs seen to be having some impact on pt weight.  Recommendations/Changes made from today's visit: Continue current regimen, writing down meal contents and figuring carb counts.  Subjective: Jeffery Fox is an 71 y.o. year old male who is a primary patient of Avva, Ravisankar, MD. The care management team was consulted for assistance with care management and/or care coordination needs.    Geriatric Nurse Practitioner completed Telephone Visit today.   Objective:  Medications Reviewed Today     Reviewed by Deloria Lair, NP (Nurse Practitioner) on 03/20/21 at 1014  Med List Status: <None>   Medication Order Taking? Sig Documenting Provider Last Dose Status Informant  acetaminophen (TYLENOL) 500 MG tablet [id number]  Take 1,000 mg by mouth in the morning and at bedtime. [provider]  Active Family Member           Med Note Alesia Banda, Thea Gist Dec 20, 2019 11:37 AM) 0800/2000  busPIRone (BUSPAR) 7.5 MG tablet [id number]  Take 7.5 mg by mouth at bedtime. [provider]  Active Family Member  CARTIA XT 120 MG 24 hr capsule [id number]  Take 120 mg by mouth daily. [provider]  Active Family Member  cetirizine (ZYRTEC) 10 MG tablet [id number]  Take 10 mg by mouth daily. [provider]  Active Family Member  diclofenac Sodium (VOLTAREN) 1 % GEL [id number]  Apply 2 g topically daily as needed (lower back pain). [provider]  Active Family Member  dorzolamide-timolol (COSOPT) 22.3-6.8 MG/ML ophthalmic solution [id number]  Place 1 drop into the right eye daily. [provider]  Active Family Member  escitalopram (LEXAPRO) 10 MG tablet [id number]  Take 10 mg by mouth every evening. [provider]  Active Family Member  famotidine  (PEPCID) 20 MG tablet [id number]  Take 20 mg by mouth daily. [provider]  Active Family Member  Glucosamine-Chondroit-Vit C-Mn (GLUCOSAMINE CHONDR 1500 COMPLX PO) [id number]  Take 1,500 mg by mouth daily. [provider]  Active Family Member  HYDROcodone-acetaminophen (NORCO/VICODIN) 5-325 MG tablet [id number]  Take 1 tablet by mouth daily as needed for moderate pain. [provider]  Active Family Member           Med Note Myrtie Neither,    Wed Nov 16, 2020 10:43 AM) Only prn at night per request for back pain.  hydrOXYzine (ATARAX/VISTARIL) 25 MG tablet [id number]  Take 25 mg by mouth every 8 (eight) hours as needed for itching. [provider]  Active Family Member           Med Note Myrtie Neither,    Wed Nov 16, 2020 10:44 AM) Only takes in pm.  Multiple Vitamin (MULTIVITAMIN WITH MINERALS) TABS tablet [id number]  Take 1 tablet by mouth daily. [provider]  Active Family Member  Probiotic Product (PROBIOTIC-10 ULTIMATE) CAPS [id number]  Take 170 mg by mouth daily. [provider]  Active Family Member  risperiDONE (RISPERDAL) 2 MG tablet [id number]  Take 2 mg by mouth at bedtime. [provider]  Active Family Member           Med Note Deloria Lair   Wed Nov 16, 2020 10:47 AM) MD has increased dose to two 75m tablets at hs.             SDOH:  (Social Determinants  of Health) assessments and interventions performed:    Care Plan  Review of patient past medical history, allergies, medications, health status, including review of consultants reports, laboratory and other test data, was performed as part of comprehensive evaluation for care management services.   Care Plan : RN Care Manager Plan of Care  Updates made by Deloria Lair, NP since 03/20/2021 12:00 AM     Problem: Quality of Life (General Plan of Care) will be maintained per caregiver report on each encounter over the next year.   Priority: High      Long-Range Goal: Patient will be have all of his basic needs met (food, shelter, safety, clothing, sleep) per brother report on each encournter over the next year.   Start Date: 01/16/2021  Expected End Date: 01/16/2022  Priority: High  Note:    Update 03/20/21:  (Status: Goal on Track (progressing): YES.) Long Term Goal  Evaluation of current treatment plan related to being totally dependent and patient's adherence to plan as established by provider       Brother Jeffery Fox, reports all pt basic needs are being met and other measures to provide a good quality of life (conversation, TV) Brother is              going to purchase a shower chair so Pt can be pushed into the shower and have a good overall bath (previously has just been                 getting bed baths).  Care Plan Update: 02/16/21 - Spoke with brother, Jeffery Fox, who reports pt needs are being met by aid and by himself.  Current Barriers:  Chronic Disease Management support and education needs related to ongoing home maintenance of a dependent family member Cognitive Deficits  RNCM Clinical Goal(s):  Patient will demonstrate ongoing health management independence as evidenced by Brother's report on each encounter over the next year.        continue to work with Consulting civil engineer and/or Social Worker to address care management and care coordination needs related to HTN and Mental challenges as evidenced by adherence to CM Team Scheduled appointments     through collaboration with RN Care manager, provider, and care team.   Interventions: Inter-disciplinary care team collaboration (see longitudinal plan of care) Evaluation of current treatment plan related to  self management and patient's adherence to plan as established by provider  Patient Goals/Self-Care Activities: Take medications as prescribed   Attend all scheduled provider appointments Call provider office for new concerns or questions        Problem: Hyptertension    Priority: Medium  Onset Date: 01/16/2021     Long-Range Goal: Blood pressure will be <140/90 on 75% of readings as evidenced by caregiver report on each encounter.   Start Date: 01/16/2021  Expected End Date: 01/16/2022  Recent Progress: On track  Priority: Medium  Note:    Update 03/20/21:  (Status: Goal on Track (progressing): YES.) Long Term Goal  Evaluation of current treatment plan related to HTN and patient's adherence to plan as established by provider Pt's BP is reported to be <140/90 on most occasions. Care giver has reported that she feels his tummy is not has big as it has been. Continue efforts to reduce carbs in pt diet, especially the amount of fruit juice. Jeffery Fox has reduced the amount and gives water at the same time. Unable to weigh pt because he is nonambulatory.   02/15/21 Care Plan Update:  Brother reports Kolden's BPs are mostly <140/90, he has had a couple of systolic readings 415-830, but >75% are in range! Advised to continue monitoring and advise MD or NP is has continual elevated readings.  Hypertension Interventions:  (Status:  New goal.) Long Term Goal Last practice recorded BP readings:  BP Readings from Last 3 Encounters:  11/23/20 110/60  07/05/20 128/76  06/30/20 126/70  Most recent eGFR/CrCl: No results found for: EGFR  No components found for: CRCL  Reviewed medications with patient and discussed importance of compliance Discussed plans with patient for ongoing care management follow up and provided patient with direct contact information for care management team Advised patient, providing education and rationale, to monitor blood pressure daily and record, calling PCP for findings outside established parameters Provided education on prescribed diet no added salt.       Plan: Telephone follow up appointment with care management team member scheduled for:  04/17/21 Brother Jeffery Fox is in agreement with the plan of care.  Eulah Pont. Myrtie Neither, MSN,  [facility] Gerontological Nurse Practitioner [facility] Care Management [phone]

## 2021-04-17 ENCOUNTER — Other Ambulatory Visit: Payer: Self-pay | Admitting: *Deleted

## 2021-04-17 NOTE — Patient Outreach (Signed)
[facility]) Care Management Geriatric Nurse Practitioner Note   04/17/2021 Name:  Leigh Anastacio MRN:  [id number] DOB:  Feb 21, 1950  Summary: Pt is stable, no new problems  Recommendations/Changes made from today's visit: Continue daily routine and call MD or NP for any new issues.   Subjective: Majesty Kis is an 71 y.o. year old male who is a primary patient of Avva, Ravisankar, MD. The care management team was consulted for assistance with care management and/or care coordination needs.    Geriatric Nurse Practitioner completed Telephone Visit today.   No Patient Care Coordination Note on file.  Patient Active Problem List   Diagnosis Date Noted   Glaucoma associated with ocular disorder, severe stage, bilateral 11/16/2020   Depression 11/16/2020   Adjustment disorder with mixed anxiety and depressed mood 11/16/2020   Mentally disabled 11/16/2020   HTN (hypertension) 11/16/2020   CVA (cerebral vascular accident) (HCC) 11/16/2020   BPH (benign prostatic hyperplasia) 07/04/2020    Medications Reviewed Today     Reviewed by Almetta Lovely, NP (Nurse Practitioner) on 03/20/21 at 1014  Med List Status: <None>   Medication Order Taking? Sig Documenting Provider Last Dose Status Informant  acetaminophen (TYLENOL) 500 MG tablet [id number]  Take 1,000 mg by mouth in the morning and at bedtime. [provider]  Active Family Member           Med Note Worthy Rancher, Cloria Spring Dec 20, 2019 11:37 AM) 0800/2000  busPIRone (BUSPAR) 7.5 MG tablet [id number]  Take 7.5 mg by mouth at bedtime. [provider]  Active Family Member  CARTIA XT 120 MG 24 hr capsule [id number]  Take 120 mg by mouth daily. [provider]  Active Family Member  cetirizine (ZYRTEC) 10 MG tablet [id number]  Take 10 mg by mouth daily. [provider]  Active Family Member  diclofenac Sodium (VOLTAREN) 1 % GEL [id number]  Apply 2 g topically daily as needed (lower back  pain). [provider]  Active Family Member  dorzolamide-timolol (COSOPT) 22.3-6.8 MG/ML ophthalmic solution [id number]  Place 1 drop into the right eye daily. [provider]  Active Family Member  escitalopram (LEXAPRO) 10 MG tablet [id number]  Take 10 mg by mouth every evening. [provider]  Active Family Member  famotidine (PEPCID) 20 MG tablet [id number]  Take 20 mg by mouth daily. [provider]  Active Family Member  Glucosamine-Chondroit-Vit C-Mn (GLUCOSAMINE CHONDR 1500 COMPLX PO) [id number]  Take 1,500 mg by mouth daily. [provider]  Active Family Member  HYDROcodone-acetaminophen (NORCO/VICODIN) 5-325 MG tablet [id number]  Take 1 tablet by mouth daily as needed for moderate pain. [provider]  Active Family Member           Med Note Burgess Estelle, Zakarie Sturdivant   Wed Nov 16, 2020 10:43 AM) Only prn at night per request for back pain.  hydrOXYzine (ATARAX/VISTARIL) 25 MG tablet [id number]  Take 25 mg by mouth every 8 (eight) hours as needed for itching. [provider]  Active Family Member           Med Note Burgess Estelle, Brach Birdsall   Wed Nov 16, 2020 10:44 AM) Only takes in pm.  Multiple Vitamin (MULTIVITAMIN WITH MINERALS) TABS tablet [id number]  Take 1 tablet by mouth daily. [provider]  Active Family Member  Probiotic Product (PROBIOTIC-10 ULTIMATE) CAPS [id number]  Take 170 mg by mouth daily. [provider]  Active Family Member  risperiDONE (RISPERDAL) 2 MG tablet [id number]  Take  2 mg by mouth at bedtime. [provider]  Active Family Member           Med Note Almetta Lovely   Wed Nov 16, 2020 10:47 AM) MD has increased dose to two 2mg  tablets at hs.           Care Plan  Review of patient past medical history, allergies, medications, health status, including review of consultants reports, laboratory and other test data, was performed as part of comprehensive evaluation for care management services.    Care Plan : RN Care Manager Plan of Care  Updates made by Almetta Lovely, NP since 04/17/2021 12:00 AM     Problem: Quality of Life (General Plan of Care) will be maintained per caregiver report on each encounter over the next year.   Priority: High     Long-Range Goal: Patient will be have all of his basic needs met (food, shelter, safety, clothing, sleep) per brother report on each encournter over the next year.   Start Date: 01/16/2021  Expected End Date: 01/16/2022  Priority: High  Note:    Update 04/17/21:  (Status: Goal on Track (progressing): YES.) Long Term Goal  Evaluation of current treatment plan related to BASIC NEEDS and patient's adherence to plan as established by provider       Brother, Roger Shelter, reports [name] is doing well. No acute problems this month. He is well cared for by the daytime hired         caregivers and his brother at night. No needs identified today.  Update 03/20/21:  (Status: Goal on Track (progressing): YES.) Long Term Goal  Evaluation of current treatment plan related to being totally dependent and patient's adherence to plan as established by provider       Brother Roger Shelter, reports all pt basic needs are being met and other measures to provide a good quality of life (conversation, TV) Brother is              going to purchase a shower chair so Pt can be pushed into the shower and have a good overall bath (previously has just been                 getting bed baths).  Care Plan Update: 02/16/21 - Spoke with brother, Roger Shelter, who reports pt needs are being met by aid and by himself.  Current Barriers:  Chronic Disease Management support and education needs related to ongoing home maintenance of a dependent family member Cognitive Deficits  RNCM Clinical Goal(s):  Patient will demonstrate ongoing health management independence as evidenced by Brother's report on each encounter over the next year.        continue to work with Medical illustrator and/or Social  Worker to address care management and care coordination needs related to HTN and Mental challenges as evidenced by adherence to CM Team Scheduled appointments     through collaboration with RN Care manager, provider, and care team.   Interventions: Inter-disciplinary care team collaboration (see longitudinal plan of care) Evaluation of current treatment plan related to  self management and patient's adherence to plan as established by provider  Patient Goals/Self-Care Activities: Take medications as prescribed   Attend all scheduled provider appointments Call provider office for new concerns or questions        Problem: Hyptertension   Priority: Medium  Onset Date: 01/16/2021     Long-Range Goal: Blood pressure will be <140/90 on 75% of readings as evidenced by caregiver report  on each encounter. Completed 04/17/2021  Start Date: 01/16/2021  Expected End Date: 01/16/2022  This Visit's Progress: On track  Recent Progress: On track  Priority: Medium  Note:    Update 04/17/21:  (Status: Goal Met.) Long Term Goal  Evaluation of current treatment plan related to HTN and patient's adherence to plan as established by provider Pt's brother reports they are no longer checking Lonney's blood pressure as it is always normal. We are closing this goal as complete.  Update 03/20/21:  (Status: Goal on Track (progressing): YES.) Long Term Goal  Evaluation of current treatment plan related to HTN and patient's adherence to plan as established by provider Pt's BP is reported to be <140/90 on most occasions. Care giver has reported that she feels his tummy is not has big as it has been. Continue efforts to reduce carbs in pt diet, especially the amount of fruit juice. Roger Shelter has reduced the amount and gives water at the same time. Unable to weigh pt because he is nonambulatory.   02/15/21 Care Plan Update: Brother reports Lonney's BPs are mostly <140/90, he has had a couple of systolic readings 140-160, but  >75% are in range! Advised to continue monitoring and advise MD or NP is has continual elevated readings.  Hypertension Interventions:  (Status:  New goal.) Long Term Goal Last practice recorded BP readings:  BP Readings from Last 3 Encounters:  11/23/20 110/60  07/05/20 128/76  06/30/20 126/70  Most recent eGFR/CrCl: No results found for: EGFR  No components found for: CRCL  Reviewed medications with patient and discussed importance of compliance Discussed plans with patient for ongoing care management follow up and provided patient with direct contact information for care management team Advised patient, providing education and rationale, to monitor blood pressure daily and record, calling PCP for findings outside established parameters Provided education on prescribed diet no added salt.       Plan: Telephone follow up appointment with care management team member scheduled for:  07/18/21  Noralyn Pick C. Burgess Estelle, MSN, [facility] Gerontological Nurse Practitioner [facility] Care Management [phone]

## 2021-04-26 DIAGNOSIS — G809 Cerebral palsy, unspecified: Secondary | ICD-10-CM | POA: Diagnosis not present

## 2021-04-26 DIAGNOSIS — F3342 Major depressive disorder, recurrent, in full remission: Secondary | ICD-10-CM | POA: Diagnosis not present

## 2021-05-03 DIAGNOSIS — Z20822 Contact with and (suspected) exposure to covid-19: Secondary | ICD-10-CM | POA: Diagnosis not present

## 2021-06-03 DIAGNOSIS — Z20822 Contact with and (suspected) exposure to covid-19: Secondary | ICD-10-CM | POA: Diagnosis not present

## 2021-06-20 DIAGNOSIS — Z20828 Contact with and (suspected) exposure to other viral communicable diseases: Secondary | ICD-10-CM | POA: Diagnosis not present

## 2021-06-20 DIAGNOSIS — Z1152 Encounter for screening for COVID-19: Secondary | ICD-10-CM | POA: Diagnosis not present

## 2021-07-18 ENCOUNTER — Other Ambulatory Visit: Payer: Self-pay | Admitting: *Deleted

## 2021-07-18 NOTE — Patient Outreach (Signed)
[facility]) Care Management Geriatric Nurse Practitioner Note   07/18/2021 Name:  Tymarion Everard MRN:  [id number] DOB:  Jan 06, 1951  Summary: Stable, well cared for.  Recommendations/Changes made from today's visit: Experiment with current standing lift and sling further before ordering an alternate sling. Check back with [employer] to see if they have anything different. Consider transitioning to a [employer] lift.  Subjective: Bretton Tandy is an 71 y.o. year old male who is a primary patient of Avva, Ravisankar, MD. The care management team was consulted for assistance with care management and/or care coordination needs.    Geriatric Nurse Practitioner completed Telephone Visit today.   Patient Active Problem List   Diagnosis Date Noted   Glaucoma associated with ocular disorder, severe stage, bilateral 11/16/2020   Depression 11/16/2020   Adjustment disorder with mixed anxiety and depressed mood 11/16/2020   Mentally disabled 11/16/2020   HTN (hypertension) 11/16/2020   CVA (cerebral vascular accident) ([place]) 11/16/2020   BPH (benign prostatic hyperplasia) 07/04/2020   Outpatient Encounter Medications as of 07/18/2021  Medication Sig Note   LORazepam (ATIVAN) 1 MG tablet Take 1 mg by mouth every 8 (eight) hours as needed for anxiety.    traZODone (DESYREL) 50 MG tablet Take 50 mg by mouth at bedtime. May take one or 2 at hs for insomnia    acetaminophen (TYLENOL) 500 MG tablet Take 1,000 mg by mouth in the morning and at bedtime. 12/20/2019: 0800/2000   busPIRone (BUSPAR) 7.5 MG tablet Take 7.5 mg by mouth at bedtime.    CARTIA XT 120 MG 24 hr capsule Take 120 mg by mouth daily.    cetirizine (ZYRTEC) 10 MG tablet Take 10 mg by mouth daily.    diclofenac Sodium (VOLTAREN) 1 % GEL Apply 2 g topically daily as needed (lower back pain).    dorzolamide-timolol (COSOPT) 22.3-6.8 MG/ML ophthalmic solution Place 1 drop into the right eye daily.    escitalopram (LEXAPRO) 10 MG tablet  Take 10 mg by mouth every evening.    famotidine (PEPCID) 20 MG tablet Take 20 mg by mouth daily.    Glucosamine-Chondroit-Vit C-Mn (GLUCOSAMINE CHONDR 1500 COMPLX PO) Take 1,500 mg by mouth daily.    HYDROcodone-acetaminophen (NORCO/VICODIN) 5-325 MG tablet Take 1 tablet by mouth daily as needed for moderate pain. 11/16/2020: Only prn at night per request for back pain.   hydrOXYzine (ATARAX/VISTARIL) 25 MG tablet Take 25 mg by mouth every 8 (eight) hours as needed for itching. 11/16/2020: Only takes in pm.   Misc Natural Products (GLUCOSAMINE CHOND MSM FORMULA PO) Take 1 tablet by mouth daily.    Multiple Vitamin (MULTIVITAMIN WITH MINERALS) TABS tablet Take 1 tablet by mouth daily.    Probiotic Product (PROBIOTIC-10 ULTIMATE) CAPS Take 170 mg by mouth daily.    risperiDONE (RISPERDAL) 2 MG tablet Take 2 mg by mouth at bedtime. 11/16/2020: MD has increased dose to two 74m tablets at hs.   No facility-administered encounter medications on file as of 07/18/2021.   Care Plan  Review of patient past medical history, allergies, medications, health status, including review of consultants reports, laboratory and other test data, was performed as part of comprehensive evaluation for care management services.   Care Plan : RN Care Manager Plan of Care  Updates made by SDeloria Lair NP since 07/18/2021 12:00 AM     Problem: Quality of Life (General Plan of Care) will be maintained per caregiver report on each encounter over the next year.   Priority: High  Long-Range Goal: Patient will be have all of his basic needs met (food, shelter, safety, clothing, sleep) per brother report on each encournter over the next year.   Start Date: 01/16/2021  Expected End Date: 01/16/2022  This Visit's Progress: On track  Priority: High  Note:    Update 07/18/21:  (Status: Goal Completed) Long Term Goal  Evaluation of current treatment plan related to Bagnell MET  and patient's adherence to plan as  established by provider Webb Laws brother reports 24 hour care is continuing in place (paid caregivers in the daytime and he is night time caregiver. Araceli Bouche is very proactive on addressing any needs that come up for his brother. GOAL COMLETED  Update 04/17/21:  (Status: Goal on Track (progressing): YES.) Long Term Goal  Evaluation of current treatment plan related to BASIC NEEDS and patient's adherence to plan as established by provider       Brother, Araceli Bouche, reports Marc Morgans is doing well. No acute problems this month. He is well cared for by the daytime hired         caregivers and his brother at night. No needs identified today.  Update 03/20/21:  (Status: Goal on Track (progressing): YES.) Long Term Goal  Evaluation of current treatment plan related to being totally dependent and patient's adherence to plan as established by provider       Brother Araceli Bouche, reports all pt basic needs are being met and other measures to provide a good quality of life (conversation, TV) Brother is              going to purchase a shower chair so Pt can be pushed into the shower and have a good overall bath (previously has just been                 getting bed baths).  Care Plan Update: 02/16/21 - Spoke with brother, Araceli Bouche, who reports pt needs are being met by aid and by himself.  Current Barriers:  Chronic Disease Management support and education needs related to ongoing home maintenance of a dependent family member Cognitive Deficits  RNCM Clinical Goal(s):  Patient will demonstrate ongoing health management independence as evidenced by Brother's report on each encounter over the next year.        continue to work with Consulting civil engineer and/or Social Worker to address care management and care coordination needs related to HTN and Mental challenges as evidenced by adherence to CM Team Scheduled appointments     through collaboration with RN Care manager, provider, and care team.    Interventions: Inter-disciplinary care team collaboration (see longitudinal plan of care) Evaluation of current treatment plan related to  self management and patient's adherence to plan as established by provider  Patient Goals/Self-Care Activities: Take medications as prescribed   Attend all scheduled provider appointments Call provider office for new concerns or questions          Plan: Will follow up in 6 months or brother to call NP as needed for advice, updates.  Araceli Bouche agrees to this schedule. Will plan of making a home visit also at that time.  Eulah Pont. Myrtie Neither, MSN, [facility] Gerontological Nurse Practitioner [facility] Care Management [phone]

## 2021-07-24 DIAGNOSIS — I10 Essential (primary) hypertension: Secondary | ICD-10-CM | POA: Diagnosis not present

## 2021-07-25 DIAGNOSIS — L600 Ingrowing nail: Secondary | ICD-10-CM | POA: Diagnosis not present

## 2021-07-25 DIAGNOSIS — R350 Frequency of micturition: Secondary | ICD-10-CM | POA: Diagnosis not present

## 2021-07-25 DIAGNOSIS — R269 Unspecified abnormalities of gait and mobility: Secondary | ICD-10-CM | POA: Diagnosis not present

## 2021-07-25 DIAGNOSIS — L03032 Cellulitis of left toe: Secondary | ICD-10-CM | POA: Diagnosis not present

## 2021-07-25 DIAGNOSIS — M6281 Muscle weakness (generalized): Secondary | ICD-10-CM | POA: Diagnosis not present

## 2021-07-25 DIAGNOSIS — F79 Unspecified intellectual disabilities: Secondary | ICD-10-CM | POA: Diagnosis not present

## 2021-07-31 DIAGNOSIS — L600 Ingrowing nail: Secondary | ICD-10-CM | POA: Diagnosis not present

## 2021-07-31 DIAGNOSIS — H5462 Unqualified visual loss, left eye, normal vision right eye: Secondary | ICD-10-CM | POA: Diagnosis not present

## 2021-07-31 DIAGNOSIS — M7712 Lateral epicondylitis, left elbow: Secondary | ICD-10-CM | POA: Diagnosis not present

## 2021-07-31 DIAGNOSIS — F418 Other specified anxiety disorders: Secondary | ICD-10-CM | POA: Diagnosis not present

## 2021-07-31 DIAGNOSIS — Z791 Long term (current) use of non-steroidal anti-inflammatories (NSAID): Secondary | ICD-10-CM | POA: Diagnosis not present

## 2021-07-31 DIAGNOSIS — F79 Unspecified intellectual disabilities: Secondary | ICD-10-CM | POA: Diagnosis not present

## 2021-07-31 DIAGNOSIS — Z8673 Personal history of transient ischemic attack (TIA), and cerebral infarction without residual deficits: Secondary | ICD-10-CM | POA: Diagnosis not present

## 2021-07-31 DIAGNOSIS — F32A Depression, unspecified: Secondary | ICD-10-CM | POA: Diagnosis not present

## 2021-07-31 DIAGNOSIS — K449 Diaphragmatic hernia without obstruction or gangrene: Secondary | ICD-10-CM | POA: Diagnosis not present

## 2021-07-31 DIAGNOSIS — M6281 Muscle weakness (generalized): Secondary | ICD-10-CM | POA: Diagnosis not present

## 2021-07-31 DIAGNOSIS — L03032 Cellulitis of left toe: Secondary | ICD-10-CM | POA: Diagnosis not present

## 2021-07-31 DIAGNOSIS — Z9181 History of falling: Secondary | ICD-10-CM | POA: Diagnosis not present

## 2021-07-31 DIAGNOSIS — H9190 Unspecified hearing loss, unspecified ear: Secondary | ICD-10-CM | POA: Diagnosis not present

## 2021-07-31 DIAGNOSIS — Z993 Dependence on wheelchair: Secondary | ICD-10-CM | POA: Diagnosis not present

## 2021-07-31 DIAGNOSIS — Z79891 Long term (current) use of opiate analgesic: Secondary | ICD-10-CM | POA: Diagnosis not present

## 2021-08-03 ENCOUNTER — Encounter: Payer: Self-pay | Admitting: Podiatry

## 2021-08-03 ENCOUNTER — Ambulatory Visit (INDEPENDENT_AMBULATORY_CARE_PROVIDER_SITE_OTHER): Payer: Medicare Other | Admitting: Podiatry

## 2021-08-03 DIAGNOSIS — M79675 Pain in left toe(s): Secondary | ICD-10-CM | POA: Diagnosis not present

## 2021-08-03 DIAGNOSIS — B351 Tinea unguium: Secondary | ICD-10-CM

## 2021-08-03 DIAGNOSIS — L03032 Cellulitis of left toe: Secondary | ICD-10-CM

## 2021-08-07 DIAGNOSIS — M6281 Muscle weakness (generalized): Secondary | ICD-10-CM | POA: Diagnosis not present

## 2021-08-07 DIAGNOSIS — M7712 Lateral epicondylitis, left elbow: Secondary | ICD-10-CM | POA: Diagnosis not present

## 2021-08-07 DIAGNOSIS — L03032 Cellulitis of left toe: Secondary | ICD-10-CM | POA: Diagnosis not present

## 2021-08-07 DIAGNOSIS — L600 Ingrowing nail: Secondary | ICD-10-CM | POA: Diagnosis not present

## 2021-08-07 DIAGNOSIS — H9190 Unspecified hearing loss, unspecified ear: Secondary | ICD-10-CM | POA: Diagnosis not present

## 2021-08-07 DIAGNOSIS — K449 Diaphragmatic hernia without obstruction or gangrene: Secondary | ICD-10-CM | POA: Diagnosis not present

## 2021-08-14 DIAGNOSIS — M7712 Lateral epicondylitis, left elbow: Secondary | ICD-10-CM | POA: Diagnosis not present

## 2021-08-14 DIAGNOSIS — L600 Ingrowing nail: Secondary | ICD-10-CM | POA: Diagnosis not present

## 2021-08-14 DIAGNOSIS — M6281 Muscle weakness (generalized): Secondary | ICD-10-CM | POA: Diagnosis not present

## 2021-08-14 DIAGNOSIS — L03032 Cellulitis of left toe: Secondary | ICD-10-CM | POA: Diagnosis not present

## 2021-08-14 DIAGNOSIS — K449 Diaphragmatic hernia without obstruction or gangrene: Secondary | ICD-10-CM | POA: Diagnosis not present

## 2021-08-14 DIAGNOSIS — H9190 Unspecified hearing loss, unspecified ear: Secondary | ICD-10-CM | POA: Diagnosis not present

## 2021-08-25 ENCOUNTER — Other Ambulatory Visit: Payer: Self-pay | Admitting: *Deleted

## 2021-08-25 NOTE — Patient Outreach (Signed)
[facility]) Care Management  08/25/2021  Jeffery Fox 1950/12/24 [id number]  Telephone outreach to Mr. [name] to advise of care management model change and that all cases for patients on contact list for > 1 month are to be closed. Roger Shelter voices understanding. Advised I will send a letter and a certificate for [facility].  Zara Council. Burgess Estelle, MSN, [facility] Gerontological Nurse Practitioner [facility] Care Management [phone]

## 2021-08-28 DIAGNOSIS — M6281 Muscle weakness (generalized): Secondary | ICD-10-CM | POA: Diagnosis not present

## 2021-08-28 DIAGNOSIS — H9190 Unspecified hearing loss, unspecified ear: Secondary | ICD-10-CM | POA: Diagnosis not present

## 2021-08-28 DIAGNOSIS — M7712 Lateral epicondylitis, left elbow: Secondary | ICD-10-CM | POA: Diagnosis not present

## 2021-08-28 DIAGNOSIS — L600 Ingrowing nail: Secondary | ICD-10-CM | POA: Diagnosis not present

## 2021-08-28 DIAGNOSIS — K449 Diaphragmatic hernia without obstruction or gangrene: Secondary | ICD-10-CM | POA: Diagnosis not present

## 2021-08-28 DIAGNOSIS — L03032 Cellulitis of left toe: Secondary | ICD-10-CM | POA: Diagnosis not present

## 2021-09-12 DIAGNOSIS — H44513 Absolute glaucoma, bilateral: Secondary | ICD-10-CM | POA: Diagnosis not present

## 2021-09-18 DIAGNOSIS — H547 Unspecified visual loss: Secondary | ICD-10-CM | POA: Diagnosis not present

## 2021-09-18 DIAGNOSIS — Z66 Do not resuscitate: Secondary | ICD-10-CM | POA: Diagnosis not present

## 2021-09-18 DIAGNOSIS — N400 Enlarged prostate without lower urinary tract symptoms: Secondary | ICD-10-CM | POA: Diagnosis not present

## 2021-09-18 DIAGNOSIS — M6281 Muscle weakness (generalized): Secondary | ICD-10-CM | POA: Diagnosis not present

## 2021-09-18 DIAGNOSIS — G8929 Other chronic pain: Secondary | ICD-10-CM | POA: Diagnosis not present

## 2021-09-18 DIAGNOSIS — F79 Unspecified intellectual disabilities: Secondary | ICD-10-CM | POA: Diagnosis not present

## 2021-09-18 DIAGNOSIS — E785 Hyperlipidemia, unspecified: Secondary | ICD-10-CM | POA: Diagnosis not present

## 2021-09-18 DIAGNOSIS — I10 Essential (primary) hypertension: Secondary | ICD-10-CM | POA: Diagnosis not present

## 2021-09-18 DIAGNOSIS — M4125 Other idiopathic scoliosis, thoracolumbar region: Secondary | ICD-10-CM | POA: Diagnosis not present

## 2021-09-18 DIAGNOSIS — R972 Elevated prostate specific antigen [PSA]: Secondary | ICD-10-CM | POA: Diagnosis not present

## 2021-09-18 DIAGNOSIS — R269 Unspecified abnormalities of gait and mobility: Secondary | ICD-10-CM | POA: Diagnosis not present

## 2021-11-28 DIAGNOSIS — Z23 Encounter for immunization: Secondary | ICD-10-CM | POA: Diagnosis not present

## 2021-12-08 DIAGNOSIS — H903 Sensorineural hearing loss, bilateral: Secondary | ICD-10-CM | POA: Diagnosis not present

## 2021-12-08 DIAGNOSIS — H6123 Impacted cerumen, bilateral: Secondary | ICD-10-CM | POA: Diagnosis not present

## 2022-01-23 ENCOUNTER — Other Ambulatory Visit: Payer: Self-pay | Admitting: *Deleted

## 2022-01-23 NOTE — Patient Outreach (Signed)
  Care Coordination   Follow Up Visit Note   01/23/2022 Name: Jeffery Fox MRN: [id number] DOB: 1950-11-28  Jeffery Fox is a 71 y.o. year old male who sees Avva, Ravisankar, MD for primary care. I spoke with Jeffery Fox, Jeffery Fox's brother by phone today.  What matters to the patients health and wellness today?  Brother reports pt is sleeping better with medication changes. No new problems. He is stable.   SDOH assessments and interventions completed:  Yes  PREVIOUSLY ADDRESSED.  Care Coordination Interventions:  Yes, provided   Follow up plan: No further intervention required.  ADVISED Jeffery Fox THAT NP IS CLOSING [facility]S CASE DUE TO HIS STABILITY AND GOOD CARE HE IS PROVIDING TO HIS BROTHER. ADVISED HE MAY STILL CALL IN THE FUTURE SHOULD ANY NEEDS ARISE.  Encounter Outcome:  Pt. Visit Completed   Noralyn Pick C. Burgess Estelle, MSN, [facility] Psychiatric Gerontological Nurse Practitioner [facility] Care Management [phone]

## 2022-04-02 DIAGNOSIS — I10 Essential (primary) hypertension: Secondary | ICD-10-CM | POA: Diagnosis not present

## 2022-04-02 DIAGNOSIS — Z125 Encounter for screening for malignant neoplasm of prostate: Secondary | ICD-10-CM | POA: Diagnosis not present

## 2022-04-02 DIAGNOSIS — E785 Hyperlipidemia, unspecified: Secondary | ICD-10-CM | POA: Diagnosis not present

## 2022-04-02 DIAGNOSIS — R7989 Other specified abnormal findings of blood chemistry: Secondary | ICD-10-CM | POA: Diagnosis not present

## 2022-04-09 DIAGNOSIS — Z0000 Encounter for general adult medical examination without abnormal findings: Secondary | ICD-10-CM | POA: Diagnosis not present

## 2022-04-09 DIAGNOSIS — H547 Unspecified visual loss: Secondary | ICD-10-CM | POA: Diagnosis not present

## 2022-04-09 DIAGNOSIS — R269 Unspecified abnormalities of gait and mobility: Secondary | ICD-10-CM | POA: Diagnosis not present

## 2022-04-09 DIAGNOSIS — G8929 Other chronic pain: Secondary | ICD-10-CM | POA: Diagnosis not present

## 2022-04-09 DIAGNOSIS — L299 Pruritus, unspecified: Secondary | ICD-10-CM | POA: Diagnosis not present

## 2022-04-09 DIAGNOSIS — F79 Unspecified intellectual disabilities: Secondary | ICD-10-CM | POA: Diagnosis not present

## 2022-04-09 DIAGNOSIS — I10 Essential (primary) hypertension: Secondary | ICD-10-CM | POA: Diagnosis not present

## 2022-04-09 DIAGNOSIS — H9190 Unspecified hearing loss, unspecified ear: Secondary | ICD-10-CM | POA: Diagnosis not present

## 2022-04-09 DIAGNOSIS — M4125 Other idiopathic scoliosis, thoracolumbar region: Secondary | ICD-10-CM | POA: Diagnosis not present

## 2022-04-09 DIAGNOSIS — E785 Hyperlipidemia, unspecified: Secondary | ICD-10-CM | POA: Diagnosis not present

## 2022-04-09 DIAGNOSIS — M6281 Muscle weakness (generalized): Secondary | ICD-10-CM | POA: Diagnosis not present

## 2022-04-09 DIAGNOSIS — R82998 Other abnormal findings in urine: Secondary | ICD-10-CM | POA: Diagnosis not present

## 2022-04-09 DIAGNOSIS — Z66 Do not resuscitate: Secondary | ICD-10-CM | POA: Diagnosis not present

## 2022-04-26 DIAGNOSIS — G4701 Insomnia due to medical condition: Secondary | ICD-10-CM | POA: Diagnosis not present

## 2022-04-26 DIAGNOSIS — F3342 Major depressive disorder, recurrent, in full remission: Secondary | ICD-10-CM | POA: Diagnosis not present

## 2022-04-26 DIAGNOSIS — F02818 Dementia in other diseases classified elsewhere, unspecified severity, with other behavioral disturbance: Secondary | ICD-10-CM | POA: Diagnosis not present

## 2022-04-26 DIAGNOSIS — G809 Cerebral palsy, unspecified: Secondary | ICD-10-CM | POA: Diagnosis not present

## 2022-07-02 NOTE — Progress Notes (Signed)
 Ophthalmology Department Clinical Visit Note  Phaco OD 08/06/2019- MG CPC OD 03/03/2019- bb (for IOP 44)  First seen by bb 02/02/2019 @ Dr. Caresse on 01/27/2019: IOP 44,5 on Dorz-Tim x2. Started Latan x1 OD CRVO OS- 2016 - NLP OS   ASSESSMENT: 1. Absolute glaucoma of both eyes       Patient is taking and tolerating dorz-tim as prescribed. NLP OU. IOP and exam stable.  Continue dorz-tim the same.  Recheck in 6 months.   PLAN: Dorzolamide -Timolol  x1 OD Recommend ATs prn  Reviewed proper drop instillation technique  Return in about 6 months (around 01/04/2023).  Ophthalmic Meds Ordered this visit:  There were no meds ordered this visit.   Patient Instructions  DAILY DRUG REMINDER  Wait 10 minutes between drops!!! (Keep eyes closed for 2 MINUTES after each drop)   Drug EYE R = Right L = Left B = Both Breakfast Lunch Supper Bedtime Time Frame  Dorzolamide -Timolol  *Cosopt * (Dark blue top)  R X      Artificial Tears  (Systane or Refresh)  B     Use as needed for comfort.           HOW TO TAKE EYEDROPS 1.   Make sure you understand when, and exactly how often, to take your eyedrop medicine.  Preferably, learn the names of your medicines, or at least be able to identify your eyedrops by the color of the cap of the bottle (e.g., "I take the purple top 2 times a day in the right eye and the yellow top 1 time a day in both eyes."). 2.  Always wash your hands prior to instilling your eyedrops. 3.  If you are putting drops in both eyes, then sit down or lie down to put in your drops.  This is because it is important to close the eye getting drops for 2 minutes immediately after instilling the drop (this markedly improves absorption).  If you are putting the drops in both eyes, then you will be closing both eyes for at least 2 minutes, therefore you should be sitting or lying down.  If you must stand to put in the drops (need to look in mirror), then have a chair next to you so  that you can sit just after instilling the drops and close your eyes for 2 minutes.  If you are only instilling an eyedrop in one eye, then you only have to close that eye after the drop, and therefore you don't necessarily have to sit or lie. 4.  If you are taking more than one drop at a time in the same eye, then you must separate the drops by at least 10 minutes. 5.  To put in the drop, hold the bottle in your dominant hand between your forefinger and thumb and invert the bottle over the targeted eye.  Your head should be tilted back and you should roll your eyes upward. 6.  With your other hand, pull the lower eyelid down and try to instill the drop in the "pouch" made by pulling the lid down, or on the lower part of the eye ball.  Position the bottle close to the eye, but do not touch the eye with the dropper tip.  Squeeze the sides of the bottle and a drop of the medicine will come out. 7.  You will feel the drop when it contacts the eye, and then you should immediately close the eye, or if you are putting the drop in both  eyes, move your hands immediately to the other eye, and place the drop in it, and then close both eyes as recommended in #2 above. 8.  While the eye/s are closed, use a tissue to wipe away any medication that remains on the lid or lashes.  You may do this during the 2 minutes that the eye remains closed, just do not open the eye to wipe it. 9.  Always remember to put the cap back on the bottle and store the bottle away from pets and not in direct sunlight.  The bottle does not have to be refrigerated, but it may be if that is preferred (some people prefer feeling the cooled eyedrop on the eye). 10.  Never stop the drops without conferring with your doctor first, even if you feel that the drops are not helping your eye.  If you believe you are experiencing side-effects from the eyedrops, then discuss that, or any issues or concerns,  with your doctor.    This document serves as a  record of services personally performed by J. Thresa Bracket, MD. It was created on their behalf by Charmaine A. Georgina, a trained medical scribe. The creation of this record is the provider's dictation and/or activities during the visit.  I agree the documentation is accurate and complete.  Electronically signed by: JINNY Thresa Bracket, MD 07/08/2022 8:53 PM

## 2022-07-04 DIAGNOSIS — H44513 Absolute glaucoma, bilateral: Secondary | ICD-10-CM | POA: Diagnosis not present

## 2022-09-18 DIAGNOSIS — E785 Hyperlipidemia, unspecified: Secondary | ICD-10-CM | POA: Diagnosis not present

## 2022-09-18 DIAGNOSIS — L299 Pruritus, unspecified: Secondary | ICD-10-CM | POA: Diagnosis not present

## 2022-09-18 DIAGNOSIS — I10 Essential (primary) hypertension: Secondary | ICD-10-CM | POA: Diagnosis not present

## 2022-09-18 DIAGNOSIS — M6281 Muscle weakness (generalized): Secondary | ICD-10-CM | POA: Diagnosis not present

## 2022-09-18 DIAGNOSIS — H547 Unspecified visual loss: Secondary | ICD-10-CM | POA: Diagnosis not present

## 2022-09-18 DIAGNOSIS — R972 Elevated prostate specific antigen [PSA]: Secondary | ICD-10-CM | POA: Diagnosis not present

## 2022-09-18 DIAGNOSIS — G8929 Other chronic pain: Secondary | ICD-10-CM | POA: Diagnosis not present

## 2022-09-18 DIAGNOSIS — M4125 Other idiopathic scoliosis, thoracolumbar region: Secondary | ICD-10-CM | POA: Diagnosis not present

## 2022-09-18 DIAGNOSIS — Z66 Do not resuscitate: Secondary | ICD-10-CM | POA: Diagnosis not present

## 2022-09-18 DIAGNOSIS — K279 Peptic ulcer, site unspecified, unspecified as acute or chronic, without hemorrhage or perforation: Secondary | ICD-10-CM | POA: Diagnosis not present

## 2022-09-18 DIAGNOSIS — F79 Unspecified intellectual disabilities: Secondary | ICD-10-CM | POA: Diagnosis not present

## 2022-09-18 DIAGNOSIS — R269 Unspecified abnormalities of gait and mobility: Secondary | ICD-10-CM | POA: Diagnosis not present

## 2022-09-21 DIAGNOSIS — H6123 Impacted cerumen, bilateral: Secondary | ICD-10-CM | POA: Diagnosis not present

## 2022-10-30 ENCOUNTER — Ambulatory Visit: Payer: Medicare Other | Attending: Internal Medicine | Admitting: Audiologist

## 2022-10-30 DIAGNOSIS — H905 Unspecified sensorineural hearing loss: Secondary | ICD-10-CM | POA: Insufficient documentation

## 2022-10-30 NOTE — Procedures (Signed)
Outpatient Audiology and [facility] [address] [place], [place]  [address] [phone]  AUDIOLOGICAL  EVALUATION  NAME: Jeffery Fox     DOB:   21-Jan-1951      MRN: [id number]                                                                                     DATE: 10/30/2022     REFERENT: Chilton Greathouse, MD STATUS: Outpatient DIAGNOSIS: Trisomy 21, Dementia, Profound Sensorineural Hearing Loss, Glaucoma   History: [name] was seen for an audiological evaluation due to profound sensorineural hearing loss bilaterally. [name] has hearing aids that he purchased from [employer], [employer].  The left hearing aid is broken and is out of warranty.  His brother is his guardian and would like to have a consultation for a new hearing test before purchasing new hearing aids.  His last hearing test was October 02, 2018.  At that time [name] had a word recognition of 28% in the right ear and 12% in the left ear at 100 dB.  Since that time 's dementia has progressed and his brother notices he is not responding as much. [name] recently had the wax removed from both ears by Dr. Suszanne Conners. [name]'s responsiveness did not improve after wax removal.    Evaluation:  Otoscopy showed a clear view of the tympanic membranes, bilaterally Tympanometry results were consistent with normal middle ear function, bilaterally   Hand overhand conditioning attempted to teach [name] to raise his hand.  However [name] was under the impression that I was his physical therapist, and attempting to help him with arm exercises.  Wake could not be consistently conditioned to respond to a tone or speech.  When asked questions every response was "yeah".  He was fit with a amplifier to check to see if the hearing aids were the issue, reliability for answering questions did not improve.    Results:  The test results were reviewed with [name] and his brother.  [name] will need to be tested under sedation using an  ABR for definitive hearing thresholds.  Brother was counseled that [name]'s profound loss and low word recognition does not make him a good hearing aid candidate, even with new or louder hearing aids.  Recommend instead that he be seen by a cochlear implant team to determine if he can be sedated for an ABR and have a cochlear implant consultation.  They used transportation and would like to be seen as close as possible to [name].  I will look to see if I can find a practice that can do both adult sedated ABR's and a cochlear implant within the limits of his transportation.    Recommendations: Cochlear implant consultation before purchasing new hearing aids.  Sedated ABR for definitive hearing thresholds pre implantation. [name] will likely not be able to participate in aided threshold testing to determine candidacy.    43 minutes spent testing and counseling on plan of care.   If you have any questions please feel free to contact me at [phone].  Ammie Ferrier    Audiologist, Au.D., CCC-A 10/30/2022  10:50 AM  Cc: Chilton Greathouse, MD

## 2022-11-19 DIAGNOSIS — Z79899 Other long term (current) drug therapy: Secondary | ICD-10-CM | POA: Diagnosis not present

## 2022-11-21 DIAGNOSIS — Z23 Encounter for immunization: Secondary | ICD-10-CM | POA: Diagnosis not present

## 2023-01-24 DIAGNOSIS — Z79899 Other long term (current) drug therapy: Secondary | ICD-10-CM | POA: Diagnosis not present

## 2023-04-11 DIAGNOSIS — Q909 Down syndrome, unspecified: Secondary | ICD-10-CM | POA: Diagnosis not present

## 2023-04-11 DIAGNOSIS — F0390 Unspecified dementia without behavioral disturbance: Secondary | ICD-10-CM | POA: Diagnosis not present

## 2023-04-11 DIAGNOSIS — H6123 Impacted cerumen, bilateral: Secondary | ICD-10-CM | POA: Diagnosis not present

## 2023-04-11 DIAGNOSIS — Z0110 Encounter for examination of ears and hearing without abnormal findings: Secondary | ICD-10-CM | POA: Diagnosis not present

## 2023-04-11 DIAGNOSIS — Z974 Presence of external hearing-aid: Secondary | ICD-10-CM | POA: Diagnosis not present

## 2023-04-25 DIAGNOSIS — F3342 Major depressive disorder, recurrent, in full remission: Secondary | ICD-10-CM | POA: Diagnosis not present

## 2023-04-25 DIAGNOSIS — G809 Cerebral palsy, unspecified: Secondary | ICD-10-CM | POA: Diagnosis not present

## 2023-04-29 DIAGNOSIS — I10 Essential (primary) hypertension: Secondary | ICD-10-CM | POA: Diagnosis not present

## 2023-04-29 DIAGNOSIS — Z125 Encounter for screening for malignant neoplasm of prostate: Secondary | ICD-10-CM | POA: Diagnosis not present

## 2023-04-29 DIAGNOSIS — R531 Weakness: Secondary | ICD-10-CM | POA: Diagnosis not present

## 2023-04-29 DIAGNOSIS — E785 Hyperlipidemia, unspecified: Secondary | ICD-10-CM | POA: Diagnosis not present

## 2023-05-06 DIAGNOSIS — F79 Unspecified intellectual disabilities: Secondary | ICD-10-CM | POA: Diagnosis not present

## 2023-05-06 DIAGNOSIS — G8929 Other chronic pain: Secondary | ICD-10-CM | POA: Diagnosis not present

## 2023-05-06 DIAGNOSIS — H547 Unspecified visual loss: Secondary | ICD-10-CM | POA: Diagnosis not present

## 2023-05-06 DIAGNOSIS — Z0000 Encounter for general adult medical examination without abnormal findings: Secondary | ICD-10-CM | POA: Diagnosis not present

## 2023-05-06 DIAGNOSIS — Z1339 Encounter for screening examination for other mental health and behavioral disorders: Secondary | ICD-10-CM | POA: Diagnosis not present

## 2023-05-06 DIAGNOSIS — Z1331 Encounter for screening for depression: Secondary | ICD-10-CM | POA: Diagnosis not present

## 2023-05-06 DIAGNOSIS — R269 Unspecified abnormalities of gait and mobility: Secondary | ICD-10-CM | POA: Diagnosis not present

## 2023-05-06 DIAGNOSIS — M6281 Muscle weakness (generalized): Secondary | ICD-10-CM | POA: Diagnosis not present

## 2023-05-06 DIAGNOSIS — R82998 Other abnormal findings in urine: Secondary | ICD-10-CM | POA: Diagnosis not present

## 2023-05-06 DIAGNOSIS — K279 Peptic ulcer, site unspecified, unspecified as acute or chronic, without hemorrhage or perforation: Secondary | ICD-10-CM | POA: Diagnosis not present

## 2023-05-06 DIAGNOSIS — M4125 Other idiopathic scoliosis, thoracolumbar region: Secondary | ICD-10-CM | POA: Diagnosis not present

## 2023-05-06 DIAGNOSIS — I10 Essential (primary) hypertension: Secondary | ICD-10-CM | POA: Diagnosis not present

## 2023-05-06 DIAGNOSIS — Z66 Do not resuscitate: Secondary | ICD-10-CM | POA: Diagnosis not present

## 2023-05-27 DIAGNOSIS — H44513 Absolute glaucoma, bilateral: Secondary | ICD-10-CM | POA: Diagnosis not present

## 2023-09-27 ENCOUNTER — Ambulatory Visit (INDEPENDENT_AMBULATORY_CARE_PROVIDER_SITE_OTHER): Payer: Medicare Other | Admitting: Otolaryngology

## 2023-09-27 ENCOUNTER — Encounter (INDEPENDENT_AMBULATORY_CARE_PROVIDER_SITE_OTHER): Payer: Self-pay | Admitting: Otolaryngology

## 2023-09-27 VITALS — Ht 62.0 in

## 2023-09-27 DIAGNOSIS — H6123 Impacted cerumen, bilateral: Secondary | ICD-10-CM

## 2023-09-27 DIAGNOSIS — H903 Sensorineural hearing loss, bilateral: Secondary | ICD-10-CM

## 2023-09-29 DIAGNOSIS — H6123 Impacted cerumen, bilateral: Secondary | ICD-10-CM | POA: Insufficient documentation

## 2023-09-29 DIAGNOSIS — H903 Sensorineural hearing loss, bilateral: Secondary | ICD-10-CM | POA: Insufficient documentation

## 2023-09-29 NOTE — Progress Notes (Signed)
 Patient ID: Jeffery Fox, male   DOB: 05/29/1950, 73 y.o.   MRN: [id number]  Follow-up: Bilateral hearing loss  HPI: The patient is a 73 year old male who returns today with his caregiver.  The patient has a history of bilateral sensorineural hearing loss.  According to the caregiver, he was fitted with new hearing aids 2 months ago.  The patient also has a history of recurrent cerumen impaction.  The patient has no recent otitis media or otitis externa.  Currently he denies any otalgia or otorrhea.  The patient is wheelchair-bound.  Exam: General: Communicates with some difficulty, well nourished, no acute distress. Head: Normocephalic, no evidence injury, no tenderness, facial buttresses intact without stepoff. Face/sinus: No tenderness to palpation and percussion. Facial movement is normal and symmetric. Eyes: Bilateral visual loss. EAC: Bilateral cerumen impaction.  Under the operating microscope, the cerumen is carefully removed with a combination of cerumen currette, alligator forceps, and suction catheters.  After the cerumen is removed, the TMs are noted to be normal. Nose: External evaluation reveals normal support and skin without lesions.  Dorsum is intact.  Anterior rhinoscopy reveals pink mucosa over anterior aspect of inferior turbinates and intact septum.  No purulence noted. Oral:  Oral cavity and oropharynx are intact, symmetric, without erythema or edema.  Mucosa is moist without lesions. Neck: Full range of motion without pain.  There is no significant lymphadenopathy.  No masses palpable.  Thyroid  bed within normal limits to palpation.  Parotid glands and submandibular glands equal bilaterally without mass.  Trachea is midline. Neuro: The patient is wheelchair-bound.  Procedure: Bilateral cerumen disimpaction Anesthesia: None Description: Under the operating microscope, the cerumen is carefully removed with a combination of cerumen currette, alligator forceps, and suction catheters.   After the cerumen is removed, the TMs are noted to be normal.  No mass, erythema, or lesions. The patient tolerated the procedure well.   Assessment: 1.  Bilateral sensorineural hearing loss.  The patient was treated with new hearing aids 2 months ago. 2.  Bilateral recurrent cerumen impaction.  Plan: 1.  Otomicroscopy with bilateral cerumen disimpaction. 2.  The physical exam findings are reviewed with the patient and his caregiver. 3.  Continue the use of his hearing aids. 4.  The patient will return for reevaluation in 1 year.

## 2023-10-23 DIAGNOSIS — M6281 Muscle weakness (generalized): Secondary | ICD-10-CM | POA: Diagnosis not present

## 2023-10-23 DIAGNOSIS — H9190 Unspecified hearing loss, unspecified ear: Secondary | ICD-10-CM | POA: Diagnosis not present

## 2023-10-23 DIAGNOSIS — R269 Unspecified abnormalities of gait and mobility: Secondary | ICD-10-CM | POA: Diagnosis not present

## 2023-10-23 DIAGNOSIS — G8929 Other chronic pain: Secondary | ICD-10-CM | POA: Diagnosis not present

## 2023-10-23 DIAGNOSIS — I10 Essential (primary) hypertension: Secondary | ICD-10-CM | POA: Diagnosis not present

## 2023-10-23 DIAGNOSIS — Z66 Do not resuscitate: Secondary | ICD-10-CM | POA: Diagnosis not present

## 2023-10-23 DIAGNOSIS — H547 Unspecified visual loss: Secondary | ICD-10-CM | POA: Diagnosis not present

## 2023-10-23 DIAGNOSIS — Z23 Encounter for immunization: Secondary | ICD-10-CM | POA: Diagnosis not present

## 2023-10-23 DIAGNOSIS — K279 Peptic ulcer, site unspecified, unspecified as acute or chronic, without hemorrhage or perforation: Secondary | ICD-10-CM | POA: Diagnosis not present

## 2023-10-23 DIAGNOSIS — M4125 Other idiopathic scoliosis, thoracolumbar region: Secondary | ICD-10-CM | POA: Diagnosis not present

## 2023-10-23 DIAGNOSIS — F79 Unspecified intellectual disabilities: Secondary | ICD-10-CM | POA: Diagnosis not present

## 2023-10-23 DIAGNOSIS — N400 Enlarged prostate without lower urinary tract symptoms: Secondary | ICD-10-CM | POA: Diagnosis not present

## 2023-12-05 DIAGNOSIS — H44513 Absolute glaucoma, bilateral: Secondary | ICD-10-CM | POA: Diagnosis not present

## 2023-12-10 DIAGNOSIS — Z23 Encounter for immunization: Secondary | ICD-10-CM | POA: Diagnosis not present
